# Patient Record
Sex: Male | Born: 1980 | Race: Black or African American | Hispanic: No | Marital: Single | Smoking: Former smoker
Health system: Southern US, Community
[De-identification: ages and names within clinical notes are randomized; demographics above are authoritative.]

## PROBLEM LIST (undated history)

## (undated) DIAGNOSIS — T7840XA Allergy, unspecified, initial encounter: Secondary | ICD-10-CM

## (undated) DIAGNOSIS — E785 Hyperlipidemia, unspecified: Secondary | ICD-10-CM

## (undated) HISTORY — DX: Allergy, unspecified, initial encounter: T78.40XA

## (undated) HISTORY — DX: Hyperlipidemia, unspecified: E78.5

---

## 2010-02-09 ENCOUNTER — Encounter: Payer: Self-pay | Admitting: Family Medicine

## 2014-01-04 ENCOUNTER — Emergency Department: Payer: Self-pay | Admitting: Emergency Medicine

## 2014-01-26 ENCOUNTER — Emergency Department: Payer: Self-pay | Admitting: Emergency Medicine

## 2014-01-26 LAB — COMPREHENSIVE METABOLIC PANEL
AST: 26 U/L (ref 15–37)
Albumin: 4.1 g/dL (ref 3.4–5.0)
Alkaline Phosphatase: 77 U/L
Anion Gap: 5 — ABNORMAL LOW (ref 7–16)
BUN: 18 mg/dL (ref 7–18)
Bilirubin,Total: 0.3 mg/dL (ref 0.2–1.0)
CHLORIDE: 102 mmol/L (ref 98–107)
Calcium, Total: 9 mg/dL (ref 8.5–10.1)
Co2: 30 mmol/L (ref 21–32)
Creatinine: 1.6 mg/dL — ABNORMAL HIGH (ref 0.60–1.30)
EGFR (African American): >60
EGFR (Non-African Amer.): 53 — ABNORMAL LOW
Glucose: 140 mg/dL — ABNORMAL HIGH (ref 65–99)
OSMOLALITY: 278 (ref 275–301)
POTASSIUM: 3.7 mmol/L (ref 3.5–5.1)
SGPT (ALT): 33 U/L
Sodium: 137 mmol/L (ref 136–145)
Total Protein: 7.6 g/dL (ref 6.4–8.2)

## 2014-01-26 LAB — CBC
HCT: 39.8 % — ABNORMAL LOW (ref 40.0–52.0)
HGB: 13.2 g/dL (ref 13.0–18.0)
MCH: 23.9 pg — AB (ref 26.0–34.0)
MCHC: 33.1 g/dL (ref 32.0–36.0)
MCV: 72 fL — AB (ref 80–100)
PLATELETS: 357 10*3/uL (ref 150–440)
RBC: 5.51 10*6/uL (ref 4.40–5.90)
RDW: 16.9 % — ABNORMAL HIGH (ref 11.5–14.5)
WBC: 16.2 10*3/uL — ABNORMAL HIGH (ref 3.8–10.6)

## 2014-01-26 LAB — URINALYSIS, COMPLETE
Bacteria: NONE SEEN
Bilirubin,UR: NEGATIVE
Blood: NEGATIVE
Glucose,UR: NEGATIVE mg/dL (ref 0–75)
KETONE: NEGATIVE
Leukocyte Esterase: NEGATIVE
Nitrite: NEGATIVE
Ph: 6 (ref 4.5–8.0)
Protein: NEGATIVE
RBC, UR: NONE SEEN /HPF (ref 0–5)
SQUAMOUS EPITHELIAL: NONE SEEN
Specific Gravity: 1.026 (ref 1.003–1.030)
WBC UR: NONE SEEN /HPF (ref 0–5)

## 2014-01-31 DIAGNOSIS — I82409 Acute embolism and thrombosis of unspecified deep veins of unspecified lower extremity: Secondary | ICD-10-CM

## 2014-01-31 HISTORY — DX: Acute embolism and thrombosis of unspecified deep veins of unspecified lower extremity: I82.409

## 2016-08-05 ENCOUNTER — Emergency Department
Admission: EM | Admit: 2016-08-05 | Discharge: 2016-08-05 | Disposition: A | Discharge status: Home / Self Care | Payer: BLUE CROSS/BLUE SHIELD | Attending: Emergency Medicine | Admitting: Emergency Medicine

## 2016-08-05 ENCOUNTER — Emergency Department: Payer: BLUE CROSS/BLUE SHIELD

## 2016-08-05 DIAGNOSIS — Y92410 Unspecified street and highway as the place of occurrence of the external cause: Secondary | ICD-10-CM | POA: Insufficient documentation

## 2016-08-05 DIAGNOSIS — G44309 Post-traumatic headache, unspecified, not intractable: Secondary | ICD-10-CM | POA: Diagnosis not present

## 2016-08-05 DIAGNOSIS — Y9389 Activity, other specified: Secondary | ICD-10-CM | POA: Diagnosis not present

## 2016-08-05 DIAGNOSIS — G44319 Acute post-traumatic headache, not intractable: Secondary | ICD-10-CM

## 2016-08-05 DIAGNOSIS — Y999 Unspecified external cause status: Secondary | ICD-10-CM | POA: Insufficient documentation

## 2016-08-05 DIAGNOSIS — S0990XA Unspecified injury of head, initial encounter: Secondary | ICD-10-CM | POA: Diagnosis present

## 2016-08-05 DIAGNOSIS — S161XXA Strain of muscle, fascia and tendon at neck level, initial encounter: Secondary | ICD-10-CM | POA: Diagnosis not present

## 2016-08-05 DIAGNOSIS — S39012A Strain of muscle, fascia and tendon of lower back, initial encounter: Secondary | ICD-10-CM

## 2016-08-05 MED ORDER — METHOCARBAMOL 500 MG PO TABS: 500.0000 mg | ORAL_TABLET | Freq: Four times a day (QID) | ORAL | 0 refills | Status: DC

## 2016-08-05 MED ORDER — MELOXICAM 15 MG PO TABS: 15.0000 mg | ORAL_TABLET | Freq: Every day | ORAL | 0 refills | Status: DC
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
## 2018-07-11 ENCOUNTER — Ambulatory Visit: Payer: BLUE CROSS/BLUE SHIELD | Admitting: Physician Assistant

## 2018-07-11 NOTE — Progress Notes (Signed)
Patient rescheduled and was not seen

## 2019-04-11 ENCOUNTER — Other Ambulatory Visit: Payer: Self-pay

## 2019-04-12 ENCOUNTER — Ambulatory Visit: Payer: Managed Care, Other (non HMO) | Admitting: Family Medicine

## 2019-04-12 ENCOUNTER — Encounter: Payer: Self-pay | Admitting: Family Medicine

## 2019-04-12 VITALS — BP 136/80 | HR 91 | Temp 96.5°F | Resp 16 | Ht 76.0 in | Wt 271.0 lb

## 2019-04-12 DIAGNOSIS — Z131 Encounter for screening for diabetes mellitus: Secondary | ICD-10-CM

## 2019-04-12 DIAGNOSIS — Z13220 Encounter for screening for lipoid disorders: Secondary | ICD-10-CM | POA: Diagnosis not present

## 2019-04-12 DIAGNOSIS — R600 Localized edema: Secondary | ICD-10-CM

## 2019-04-12 DIAGNOSIS — J3089 Other allergic rhinitis: Secondary | ICD-10-CM | POA: Diagnosis not present

## 2019-04-12 DIAGNOSIS — E669 Obesity, unspecified: Secondary | ICD-10-CM | POA: Insufficient documentation

## 2019-04-12 DIAGNOSIS — E6609 Other obesity due to excess calories: Secondary | ICD-10-CM | POA: Diagnosis not present

## 2019-04-12 DIAGNOSIS — Z6832 Body mass index (BMI) 32.0-32.9, adult: Secondary | ICD-10-CM

## 2019-04-12 DIAGNOSIS — E66811 Obesity, class 1: Secondary | ICD-10-CM | POA: Insufficient documentation

## 2019-04-12 LAB — CBC
HCT: 44.7 % (ref 39.0–52.0)
Hemoglobin: 15.3 g/dL (ref 13.0–17.0)
MCHC: 34.3 g/dL (ref 30.0–36.0)
MCV: 74.6 fl — ABNORMAL LOW (ref 78.0–100.0)
Platelets: 274 10*3/uL (ref 150.0–400.0)
RBC: 6 Mil/uL — ABNORMAL HIGH (ref 4.22–5.81)
RDW: 17.4 % — ABNORMAL HIGH (ref 11.5–15.5)
WBC: 10.4 10*3/uL (ref 4.0–10.5)

## 2019-04-12 LAB — LIPID PANEL
Cholesterol: 180 mg/dL (ref 0–200)
HDL: 33.4 mg/dL — ABNORMAL LOW (ref >39.00)
LDL Cholesterol: 123 mg/dL — ABNORMAL HIGH (ref 0–99)
NonHDL: 146.16
Total CHOL/HDL Ratio: 5
Triglycerides: 117 mg/dL (ref 0.0–149.0)
VLDL: 23.4 mg/dL (ref 0.0–40.0)

## 2019-04-12 LAB — BASIC METABOLIC PANEL
BUN: 11 mg/dL (ref 6–23)
CO2: 29 mEq/L (ref 19–32)
Calcium: 9.4 mg/dL (ref 8.4–10.5)
Chloride: 103 mEq/L (ref 96–112)
Creatinine, Ser: 0.88 mg/dL (ref 0.40–1.50)
GFR: 116.22 mL/min (ref >60.00)
Glucose, Bld: 133 mg/dL — ABNORMAL HIGH (ref 70–99)
Potassium: 4.1 mEq/L (ref 3.5–5.1)
Sodium: 137 mEq/L (ref 135–145)

## 2019-04-12 MED ORDER — PREDNISONE 20 MG PO TABS: 40.0000 mg | ORAL_TABLET | Freq: Every day | ORAL | 0 refills | Status: AC

## 2019-04-12 MED ORDER — MOMETASONE FUROATE 50 MCG/ACT NA SUSP: 2.0000 | Freq: Every day | NASAL | 12 refills | Status: DC

## 2019-04-12 MED ORDER — MONTELUKAST SODIUM 10 MG PO TABS: 10.0000 mg | ORAL_TABLET | Freq: Every day | ORAL | 3 refills | Status: DC

## 2019-04-12 NOTE — Progress Notes (Signed)
HPI:   Derek Wang is a 39 y.o. male, who is here today to establish care.  Former PCP: He moved from [facility] recently. Last preventive routine visit: "Years ago."  Chronic medical problems: Allergies. 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 He is not on anticoagulation. Former smoker,quit about 12 years ago.  He does not exercise regularly and has not been complaint with following a healthful diet.  Concerns today: "Sinuses" Eye and nose pruritus,sneezing,nasal congestion,rhinorrha,and post nasal drainage. + Tearing eyes.  Symptoms have been going on for " a while."  Negative for fever,chills,sore throat, cough,wheezing,or SOB. He has taken Zyrtec but caused drowsiness. Flonase nasal spray has not helped.  Review of Systems  Constitutional: Positive for fatigue. Negative for activity change, appetite change and fever.  HENT: Negative for mouth sores, nosebleeds and trouble swallowing.   Eyes: Negative for redness and visual disturbance.  Cardiovascular: Positive for leg swelling (LLE ,chronic). Negative for chest pain and palpitations.  Gastrointestinal: Negative for abdominal pain, nausea and vomiting.  Genitourinary: Negative for decreased urine volume and hematuria.  Musculoskeletal: Negative for gait problem and myalgias.  Skin: Negative for rash and wound.  Allergic/Immunologic: Positive for environmental allergies.  Neurological: Negative for weakness and headaches.  Psychiatric/Behavioral: Negative for confusion. The patient is nervous/anxious.   Rest see pertinent positives and negatives per HPI.  No current outpatient medications on file prior to visit.   No current facility-administered medications on file prior to visit.   History reviewed. No pertinent past medical history. No Known Allergies  History reviewed. No pertinent family history.  Social History   Socioeconomic History  . Marital status: Single   Spouse name: Not on file  . Number of children: Not on file  . Years of education: Not on file  . Highest education level: Not on file  Occupational History  . Not on file  Tobacco Use  . Smoking status: Former Research scientist (life sciences)  . Smokeless tobacco: Never Used  Substance and Sexual Activity  . Alcohol use: Not Currently  . Drug use: Never  . Sexual activity: Yes    Partners: Female  Other Topics Concern  . Not on file  Social History Narrative  . Not on file   Social Determinants of Health   Financial Resource Strain:   . Difficulty of Paying Living Expenses:   Food Insecurity:   . Worried About Charity fundraiser in the Last Year:   . Arboriculturist in the Last Year:   Transportation Needs:   . Film/video editor (Medical):   Marland Kitchen Lack of Transportation (Non-Medical):   Physical Activity:   . Days of Exercise per Week:   . Minutes of Exercise per Session:   Stress:   . Feeling of Stress :   Social Connections:   . Frequency of Communication with Friends and Family:   . Frequency of Social Gatherings with Friends and Family:   . Attends Religious Services:   . Active Member of Clubs or Organizations:   . Attends Archivist Meetings:   Marland Kitchen Marital Status:     Vitals:   04/12/19 0828  BP: 136/80  Pulse: 91  Resp: 16  Temp: (!) 96.5 F (35.8 C)  SpO2: 98%    Body mass index is 32.99 kg/m.   Physical Exam  Nursing note and vitals reviewed. Constitutional: He is oriented to person, place, and time. He appears well-developed. No distress.  HENT:  Head: Normocephalic and atraumatic.  Nose: Rhinorrhea and septal deviation present. Right sinus exhibits no maxillary sinus tenderness and no frontal sinus tenderness. Left sinus exhibits no maxillary sinus tenderness and no frontal sinus tenderness.  Mouth/Throat: Oropharynx is clear and moist and mucous membranes are normal.  Hypertrophic turbinate left side. Nasal voice.  Eyes: Pupils are equal, round, and  reactive to light. Conjunctivae are normal.  Cardiovascular: Normal rate and regular rhythm.  No murmur heard. Pulses:      Dorsalis pedis pulses are 2+ on the right side and 2+ on the left side.  Respiratory: Effort normal and breath sounds normal. No respiratory distress.  GI: Soft. He exhibits no mass. There is no hepatomegaly. There is no abdominal tenderness.  Musculoskeletal:        General: Edema (1+ pitting LE edema,L>R) present.  Lymphadenopathy:    He has no cervical adenopathy.  Neurological: He is alert and oriented to person, place, and time. He has normal strength. No cranial nerve deficit. Gait normal.  Skin: Skin is warm. No rash noted. No erythema.  Psychiatric: He has a normal mood and affect. Cognition and memory are normal.  Well groomed, good eye contact.    ASSESSMENT AND PLAN:  Derek Wang was seen today for establish care.  Diagnoses and all orders for this visit:  Lab Results  Component Value Date   WBC 10.4 04/12/2019   HGB 15.3 04/12/2019   HCT 44.7 04/12/2019   MCV 74.6 (L) 04/12/2019   PLT 274.0 04/12/2019   Lab Results  Component Value Date   CHOL 180 04/12/2019   HDL 33.40 (L) 04/12/2019   LDLCALC 123 (H) 04/12/2019   TRIG 117.0 04/12/2019   CHOLHDL 5 04/12/2019   Lab Results  Component Value Date   CREATININE 0.88 04/12/2019   BUN 11 04/12/2019   NA 137 04/12/2019   K 4.1 04/12/2019   CL 103 04/12/2019   CO2 29 04/12/2019    Non-seasonal allergic rhinitis, unspecified trigger Educated about Dx,prognosis,and treatment options. Nasal saline irrigations as needed. Stop Flonase and try Nasonex nasal spray. Singulair 10 mg daily. Prednisone side effects discussed.  -     CBC -     mometasone (NASONEX) 50 MCG/ACT nasal spray; Place 2 sprays into the nose daily. -     montelukast (SINGULAIR) 10 MG tablet; Take 1 tablet (10 mg total) by mouth at bedtime. -     predniSONE (DELTASONE) 20 MG tablet; Take 2 tablets (40 mg total) by mouth  daily with breakfast for 5 days.  Class 1 obesity due to excess calories with body mass index (BMI) of 32.0 to 32.9 in adult, unspecified whether serious comorbidity present We discussed benefits of wt loss as well as adverse effects of obesity. Consistency with healthy diet and physical activity recommended.  Diabetes mellitus screening -     Basic metabolic panel  Screening for lipid disorders -     Lipid panel  Lower extremity edema Chronic,lymphedema. We will obtain past records and determine the need for further studies and/or anticoagulation. He has compression stocks he wears frequent and helps.  Return in about 2 months (around 06/12/2019) for allergic rhinitis..     Cynthis Purington G. [place], MD  [facility]. [place] [facility].    A few things to remember from today's visit:   Non-seasonal allergic rhinitis, unspecified trigger - Plan: CBC, mometasone (NASONEX) 50 MCG/ACT nasal spray, montelukast (SINGULAIR) 10 MG tablet, predniSONE (DELTASONE) 20 MG tablet  Class  1 obesity due to excess calories with body mass index (BMI) of 32.0 to 32.9 in adult, unspecified whether serious comorbidity present  Diabetes mellitus screening - Plan: Basic metabolic panel  Screening for lipid disorders - Plan: Lipid panel  Lower extremity edema   There are 2 forms of allergic rhinitis: . Seasonal (hay fever): Caused by an allergy to pollen and/or mold spores in the air. Pollen is the fine powder that comes from the stamen of flowering plants. It can be carried through the air and is easily inhaled. Symptoms are seasonal and usually occur in spring, late summer, and fall. Marland Kitchen Perennial: Caused by other allergens such as dust mites, pet hair or dander, or mold. Symptoms occur year-round.  Symptoms: Your symptoms can vary, depending on the severity of your allergies. Symptoms can include: Sneezing, coughing.itching (mostly eyes, nose, mouth, throat and skin),runny nose,stuffy  nose.headache,pressure in the nose and cheeks,ear fullness and popping, sore throat.watery, red, or swollen eyes,dark circles under your eyes,trouble smelling, and sometimes hives.  Allergic rhinitis cannot be prevented. You can help your symptoms by avoiding the things that you are allergic, including: . Keeping windows closed. This is especially important during high-pollen seasons. . Washing your hands after petting animals. . Using dust- and mite-proof bedding and mattress covers. . Wearing glasses outside to protect your eyes. . Showering before bed to wash off allergens from hair and skin. You can also avoid things that can make your symptoms worse, such as: . aerosol sprays . air pollution . cold temperatures . humidity . irritating fumes . tobacco smoke . wind . wood smoke.   Antihistamines help reduce the sneezing, runny nose, and itchiness of allergies. These come in pill form and as nasal sprays. Allegra,Zyrtec,or Claritin are some examples. Decongestants, such as pseudoephedrine and phenylephrine, help temporarily relieve the stuffy nose of allergies. Decongestants are found in many medicines and come as pills, nose sprays, and nose drops. They could increase heart rate and cause tachycardia and tremor. Nasal Afrin should not be used for more than 3 days because you can become dependent on them. This causes you to feel even more stopped-up when you try to quit using them.  Nasal sprays: steroids or antihistaminics. Over the counter intranasal sterids: Nasocort,Rhinocort,or Flonase.You won't notice their benefits for up to 2 weeks after starting them. Allergy shots or sublingual tablets when other treatment do not help.This is done by immunologists.

## 2019-04-12 NOTE — Patient Instructions (Addendum)
A few things to remember from today's visit:   Diabetes mellitus screening - Plan: Basic metabolic panel  Non-seasonal allergic rhinitis, unspecified trigger - Plan: CBC  Screening for lipid disorders - Plan: Lipid panel  Lower extremity edema  Take Prednisone with food. Please sings a release form,so we can obtain hospital records. Nasal irrigations with saline.  There are 2 forms of allergic rhinitis: . Seasonal (hay fever): Caused by an allergy to pollen and/or mold spores in the air. Pollen is the fine powder that comes from the stamen of flowering plants. It can be carried through the air and is easily inhaled. Symptoms are seasonal and usually occur in spring, late summer, and fall. Marland Kitchen Perennial: Caused by other allergens such as dust mites, pet hair or dander, or mold. Symptoms occur year-round.  Symptoms: Your symptoms can vary, depending on the severity of your allergies. Symptoms can include: Sneezing, coughing.itching (mostly eyes, nose, mouth, throat and skin),runny nose,stuffy nose.headache,pressure in the nose and cheeks,ear fullness and popping, sore throat.watery, red, or swollen eyes,dark circles under your eyes,trouble smelling, and sometimes hives.  Allergic rhinitis cannot be prevented. You can help your symptoms by avoiding the things that you are allergic, including: . Keeping windows closed. This is especially important during high-pollen seasons. . Washing your hands after petting animals. . Using dust- and mite-proof bedding and mattress covers. . Wearing glasses outside to protect your eyes. . Showering before bed to wash off allergens from hair and skin. You can also avoid things that can make your symptoms worse, such as: . aerosol sprays . air pollution . cold temperatures . humidity . irritating fumes . tobacco smoke . wind . wood smoke.   Antihistamines help reduce the sneezing, runny nose, and itchiness of allergies. These come in pill form and as  nasal sprays. Allegra,Zyrtec,or Claritin are some examples. Decongestants, such as pseudoephedrine and phenylephrine, help temporarily relieve the stuffy nose of allergies. Decongestants are found in many medicines and come as pills, nose sprays, and nose drops. They could increase heart rate and cause tachycardia and tremor. Nasal Afrin should not be used for more than 3 days because you can become dependent on them. This causes you to feel even more stopped-up when you try to quit using them.  Nasal sprays: steroids or antihistaminics. Over the counter intranasal sterids: Nasocort,Rhinocort,or Flonase.You won't notice their benefits for up to 2 weeks after starting them. Allergy shots or sublingual tablets when other treatment do not help.This is done by immunologists.   Please be sure medication list is accurate. If a new problem present, please set up appointment sooner than planned today.

## 2019-04-14 ENCOUNTER — Encounter: Payer: Self-pay | Admitting: Family Medicine

## 2019-04-19 ENCOUNTER — Other Ambulatory Visit: Payer: Self-pay | Admitting: Family Medicine

## 2019-04-19 MED ORDER — TRIAMCINOLONE ACETONIDE 55 MCG/ACT NA AERO
2.0000 | INHALATION_SPRAY | Freq: Every day | NASAL | 2 refills | Status: DC
Start: 2019-04-19 — End: 2022-04-13

## 2019-04-24 ENCOUNTER — Telehealth: Payer: Self-pay | Admitting: Family Medicine

## 2019-04-24 NOTE — Telephone Encounter (Signed)
ROI faxed to [facility] 3/24/21fbg

## 2019-04-25 NOTE — Telephone Encounter (Signed)
Recv'd medical records from [facility] forwarded 45 pages to Dr. Kathie Rhodes Jordan([facility])3/25/21fbg

## 2019-06-12 ENCOUNTER — Ambulatory Visit: Payer: Managed Care, Other (non HMO) | Admitting: Family Medicine

## 2019-06-17 ENCOUNTER — Other Ambulatory Visit: Payer: Self-pay

## 2019-06-18 ENCOUNTER — Encounter: Payer: Self-pay | Admitting: Family Medicine

## 2019-06-18 ENCOUNTER — Ambulatory Visit (INDEPENDENT_AMBULATORY_CARE_PROVIDER_SITE_OTHER): Payer: Managed Care, Other (non HMO) | Admitting: Family Medicine

## 2019-06-18 VITALS — BP 140/90 | HR 76 | Temp 98.3°F | Resp 16 | Ht 76.0 in | Wt 270.2 lb

## 2019-06-18 DIAGNOSIS — J3089 Other allergic rhinitis: Secondary | ICD-10-CM

## 2019-06-18 DIAGNOSIS — E6609 Other obesity due to excess calories: Secondary | ICD-10-CM

## 2019-06-18 DIAGNOSIS — Z6832 Body mass index (BMI) 32.0-32.9, adult: Secondary | ICD-10-CM

## 2019-06-18 DIAGNOSIS — E785 Hyperlipidemia, unspecified: Secondary | ICD-10-CM

## 2019-06-18 DIAGNOSIS — E1169 Type 2 diabetes mellitus with other specified complication: Secondary | ICD-10-CM | POA: Insufficient documentation

## 2019-06-18 DIAGNOSIS — R739 Hyperglycemia, unspecified: Secondary | ICD-10-CM

## 2019-06-18 DIAGNOSIS — G4730 Sleep apnea, unspecified: Secondary | ICD-10-CM

## 2019-06-18 DIAGNOSIS — E669 Obesity, unspecified: Secondary | ICD-10-CM

## 2019-06-18 LAB — MICROALBUMIN / CREATININE URINE RATIO
Creatinine,U: 221.4 mg/dL
Microalb Creat Ratio: 1.8 mg/g (ref 0.0–30.0)
Microalb, Ur: 4 mg/dL — ABNORMAL HIGH (ref 0.0–1.9)

## 2019-06-18 LAB — POCT GLYCOSYLATED HEMOGLOBIN (HGB A1C): Hemoglobin A1C: 6.7 % — AB (ref 4.0–5.6)

## 2019-06-18 MED ORDER — FLUTICASONE PROPIONATE 50 MCG/ACT NA SUSP: 2.0000 | Freq: Every day | NASAL | 6 refills | Status: DC

## 2019-06-18 MED ORDER — ATORVASTATIN CALCIUM 10 MG PO TABS: 10.0000 mg | ORAL_TABLET | Freq: Every day | ORAL | 3 refills | Status: DC

## 2019-06-18 NOTE — Patient Instructions (Addendum)
A few things to remember from today's visit:   Start Aspirin 81 meg daily. Continue working on wt loss. Sleep study will be arranged. If possible monitor blood pressure occasionally.  Low salt diet.  Zyrtec 10 mg am and continue Singulair at night. Nasal saline irrigations and try Flonase again.  Today your hemoglobin A1C showed diabetes. We can try with no medication for now, nutrition referral placed.  Atorvastatin 10 mg added for cardiovascular protection.  If you need refills please call your pharmacy. Do not use My Chart to request refills or for acute issues that need immediate attention.    Diabetes Basics  Diabetes (diabetes mellitus) is a long-term (chronic) disease. It occurs when the body does not properly use sugar (glucose) that is released from food after you eat. Diabetes may be caused by one or both of these problems:  Your pancreas does not make enough of a hormone called insulin.  Your body does not react in a normal way to insulin that it makes. Insulin lets sugars (glucose) go into cells in your body. This gives you energy. If you have diabetes, sugars cannot get into cells. This causes high blood sugar (hyperglycemia). Follow these instructions at home: How is diabetes treated? You may need to take insulin or other diabetes medicines daily to keep your blood sugar in balance. Take your diabetes medicines every day as told by your doctor. List your diabetes medicines here: Diabetes medicines  Name of medicine: ______________________________ ? Amount (dose): _______________ Time (a.m./p.m.): _______________ Notes: ___________________________________  Name of medicine: ______________________________ ? Amount (dose): _______________ Time (a.m./p.m.): _______________ Notes: ___________________________________  Name of medicine: ______________________________ ? Amount (dose): _______________ Time (a.m./p.m.): _______________ Notes:  ___________________________________ If you use insulin, you will learn how to give yourself insulin by injection. You may need to adjust the amount based on the food that you eat. List the types of insulin you use here: Insulin  Insulin type: ______________________________ ? Amount (dose): _______________ Time (a.m./p.m.): _______________ Notes: ___________________________________  Insulin type: ______________________________ ? Amount (dose): _______________ Time (a.m./p.m.): _______________ Notes: ___________________________________  Insulin type: ______________________________ ? Amount (dose): _______________ Time (a.m./p.m.): _______________ Notes: ___________________________________  Insulin type: ______________________________ ? Amount (dose): _______________ Time (a.m./p.m.): _______________ Notes: ___________________________________  Insulin type: ______________________________ ? Amount (dose): _______________ Time (a.m./p.m.): _______________ Notes: ___________________________________ How do I manage my blood sugar?  Check your blood sugar levels using a blood glucose monitor as directed by your doctor. Your doctor will set treatment goals for you. Generally, you should have these blood sugar levels:  Before meals (preprandial): 80-130 mg/dL (4.4-7.2 mmol/L).  After meals (postprandial): below 180 mg/dL (10 mmol/L).  A1c level: less than 7%. Write down the times that you will check your blood sugar levels: Blood sugar checks  Time: _______________ Notes: ___________________________________  Time: _______________ Notes: ___________________________________  Time: _______________ Notes: ___________________________________  Time: _______________ Notes: ___________________________________  Time: _______________ Notes: ___________________________________  Time: _______________ Notes: ___________________________________  What do I need to know about low blood sugar? Low  blood sugar is called hypoglycemia. This is when blood sugar is at or below 70 mg/dL (3.9 mmol/L). Symptoms may include:  Feeling: ? Hungry. ? Worried or nervous (anxious). ? Sweaty and clammy. ? Confused. ? Dizzy. ? Sleepy. ? Sick to your stomach (nauseous).  Having: ? A fast heartbeat. ? A headache. ? A change in your vision. ? Tingling or no feeling (numbness) around the mouth, lips, or tongue. ? Jerky movements that you cannot control (seizure).  Having trouble with: ? Moving (  coordination). ? Sleeping. ? Passing out (fainting). ? Getting upset easily (irritability). Treating low blood sugar To treat low blood sugar, eat or drink something sugary right away. If you can think clearly and swallow safely, follow the 15:15 rule:  Take 15 grams of a fast-acting carb (carbohydrate). Talk with your doctor about how much you should take.  Some fast-acting carbs are: ? Sugar tablets (glucose pills). Take 3-4 glucose pills. ? 6-8 pieces of hard candy. ? 4-6 oz (120-150 mL) of fruit juice. ? 4-6 oz (120-150 mL) of regular (not diet) soda. ? 1 Tbsp (15 mL) honey or sugar.  Check your blood sugar 15 minutes after you take the carb.  If your blood sugar is still at or below 70 mg/dL (3.9 mmol/L), take 15 grams of a carb again.  If your blood sugar does not go above 70 mg/dL (3.9 mmol/L) after 3 tries, get help right away.  After your blood sugar goes back to normal, eat a meal or a snack within 1 hour. Treating very low blood sugar If your blood sugar is at or below 54 mg/dL (3 mmol/L), you have very low blood sugar (severe hypoglycemia). This is an emergency. Do not wait to see if the symptoms will go away. Get medical help right away. Call your local emergency services (911 in the [place] Do not drive yourself to the hospital. Questions to ask your health care provider  Do I need to meet with a diabetes educator?  What equipment will I need to care for myself at home?  What  diabetes medicines do I need? When should I take them?  How often do I need to check my blood sugar?  What number can I call if I have questions?  When is my next doctor's visit?  Where can I find a support group for people with diabetes? Where to find more information  American Diabetes Association: www.diabetes.org  American Association of Diabetes Educators: www.diabeteseducator.org/patient-resources Contact a doctor if:  Your blood sugar is at or above 240 mg/dL (03.8 mmol/L) for 2 days in a row.  You have been sick or have had a fever for 2 days or more, and you are not getting better.  You have any of these problems for more than 6 hours: ? You cannot eat or drink. ? You feel sick to your stomach (nauseous). ? You throw up (vomit). ? You have watery poop (diarrhea). Get help right away if:  Your blood sugar is lower than 54 mg/dL (3 mmol/L).  You get confused.  You have trouble: ? Thinking clearly. ? Breathing. Summary  Diabetes (diabetes mellitus) is a long-term (chronic) disease. It occurs when the body does not properly use sugar (glucose) that is released from food after digestion.  Take insulin and diabetes medicines as told.  Check your blood sugar every day, as often as told.  Keep all follow-up visits as told by your doctor. This is important. This information is not intended to replace advice given to you by your health care provider. Make sure you discuss any questions you have with your health care provider. Document Revised: 10/10/2018 Document Reviewed: 04/21/2017 Elsevier Patient Education  2020 [employer]. Please be sure medication list is accurate. If a new problem present, please set up appointment sooner than planned today.

## 2019-06-18 NOTE — Progress Notes (Signed)
HPI:  [name] is a 39 y.o. male, who is here today for follow up.   He was last seen on 04/12/19. He did not receive Nasonex nasal spray, his insurance did not cover it. Nasocort was expensive. He is not doing nasal irrigations. Zyrtec and Singulair have helped.  Laud snoring. Sleep apnea has been witnessed in the past by girlfriend. Sometimes he does not feel rested when he first gets up. Occasional morning headache.  Reviewing records I found one episode of DVT LLE in 2012. In 2011 he presented with palpate fossa pain, thought to be meniscal related. Apparently he had coagulation studies done in 2012, I do not see results.  Glucose has been elevated in the past: 13 (140). No known Hx of DM II. Denies polydipsia,polyuria, or polyphagia.  Review of Systems  Constitutional: Negative for activity change, appetite change, fatigue and fever.  HENT: Negative for mouth sores, nosebleeds and sore throat.   Respiratory: Negative for cough, shortness of breath and wheezing.   Cardiovascular: Negative for chest pain, palpitations and leg swelling.  Gastrointestinal: Negative for abdominal pain.  Genitourinary: Negative for decreased urine volume, dysuria and hematuria.  Neurological: Negative for syncope, facial asymmetry and weakness.  Rest of ROS, see pertinent positives sand negatives in HPI  Current Outpatient Medications on File Prior to Visit  Medication Sig Dispense Refill  . montelukast (SINGULAIR) 10 MG tablet Take 1 tablet (10 mg total) by mouth at bedtime. 30 tablet 3  . triamcinolone (NASACORT) 55 MCG/ACT AERO nasal inhaler Place 2 sprays into the nose daily. (Patient not taking: Reported on 06/18/2019) 1 Inhaler 2   No current facility-administered medications on file prior to visit.   Past Medical History:  Diagnosis Date  . Allergy   . DVT (deep venous thrombosis) (HCC) 2016  . Hyperlipidemia    No Known Allergies  Social History    Socioeconomic History  . Marital status: Single    Spouse name: Not on file  . Number of children: Not on file  . Years of education: Not on file  . Highest education level: Not on file  Occupational History  . Not on file  Tobacco Use  . Smoking status: Former Games developer  . Smokeless tobacco: Never Used  Substance and Sexual Activity  . Alcohol use: Not Currently  . Drug use: Never  . Sexual activity: Yes    Partners: Female  Other Topics Concern  . Not on file  Social History Narrative  . Not on file   Social Determinants of Health   Financial Resource Strain:   . Difficulty of Paying Living Expenses:   Food Insecurity:   . Worried About Programme researcher, broadcasting/film/video in the Last Year:   . Barista in the Last Year:   Transportation Needs:   . Freight forwarder (Medical):   Marland Kitchen Lack of Transportation (Non-Medical):   Physical Activity:   . Days of Exercise per Week:   . Minutes of Exercise per Session:   Stress:   . Feeling of Stress :   Social Connections:   . Frequency of Communication with Friends and Family:   . Frequency of Social Gatherings with Friends and Family:   . Attends Religious Services:   . Active Member of Clubs or Organizations:   . Attends Banker Meetings:   Marland Kitchen Marital Status:     Vitals:   06/18/19 0715  BP: 140/90  Pulse: 76  Resp:  16  Temp: 98.3 F (36.8 C)  SpO2: 97%   Body mass index is 32.89 kg/m. Wt Readings from Last 3 Encounters:  06/18/19 270 lb 3.2 oz (122.6 kg)  04/12/19 271 lb (122.9 kg)  08/05/16 270 lb (122.5 kg)   Physical Exam  Nursing note and vitals reviewed. Constitutional: He is oriented to person, place, and time. He appears well-developed. No distress.  HENT:  Head: Normocephalic and atraumatic.  Nose: Septal deviation present.  Mouth/Throat: Oropharynx is clear and moist and mucous membranes are normal.  Hypertrophic turbinates.  Eyes: Pupils are equal, round, and reactive to light.  Conjunctivae are normal.  Cardiovascular: Normal rate and regular rhythm.  No murmur heard. Respiratory: Effort normal and breath sounds normal. No respiratory distress.  GI: Soft. He exhibits no mass. There is no hepatomegaly. There is no abdominal tenderness.  Musculoskeletal:        General: Edema (Trace pitting LE edema,bilateral.) present.  Lymphadenopathy:    He has no cervical adenopathy.  Neurological: He is alert and oriented to person, place, and time. He has normal strength. No cranial nerve deficit. Gait normal.  Skin: Skin is warm. No rash noted. No erythema.  Psychiatric: He has a normal mood and affect.  Well groomed, good eye contact.   ASSESSMENT AND PLAN:  [name] was seen today for follow-up.  Orders Placed This Encounter  Procedures  . Microalbumin / creatinine urine ratio  . Amb Referral to Nutrition and Diabetic E  . POC HgB A1c  . Home sleep test   Lab Results  Component Value Date   MICROALBUR 4.0 (H) 06/18/2019   Lab Results  Component Value Date   HGBA1C 6.7 (A) 06/18/2019    1. Diabetes mellitus type 2 in obese [facility]) New Dx. Educated about prognosis and treatment options. He will try non pharmacologic treatment. Nutrition and diabetes education will be arranged.  2. Hyperglycemia - POC HgB A1c  3. Class 1 obesity due to excess calories with body mass index (BMI) of 32.0 to 32.9 in adult, unspecified whether serious comorbidity present We discussed benefits of wt loss as well as adverse effects of obesity. Consistency with healthy diet and physical activity recommended.  4. Sleep apnea, unspecified type We discussed adverse effects of OSA. Wt loss will help. Sleep study will be arranged.  5. Non-seasonal allergic rhinitis, unspecified trigger He agrees with trying Flonase again. Nasal saline irrigations as needed. No changes in Singulair or Zyrtec.  - fluticasone (FLONASE) 50 MCG/ACT nasal spray; Place 2 sprays into  both nostrils daily.  Dispense: 16 g; Refill: 6  6. Dyslipidemia associated with type 2 diabetes mellitus (HCC) Benefit of statins discussed. Atorvastatin side effects discussed. Low fat diet also recommended.  - atorvastatin (LIPITOR) 10 MG tablet; Take 1 tablet (10 mg total) by mouth daily.  Dispense: 90 tablet; Refill: 3   Return in about 16 weeks (around 10/08/2019).   Dreshon Proffit G. [place], MD  [facility]. [place] office.   A few things to remember from today's visit:   Start Aspirin 81 meg daily. Continue working on wt loss. Sleep study will be arranged. If possible monitor blood pressure occasionally.  Low salt diet.  Zyrtec 10 mg am and continue Singulair at night. Nasal saline irrigations and try Flonase again.  Today your hemoglobin A1C showed diabetes. We can try with no medication for now, nutrition referral placed.  Atorvastatin 10 mg added for cardiovascular protection.  If you need refills please call  your pharmacy. Do not use My Chart to request refills or for acute issues that need immediate attention.    Diabetes Basics  Diabetes (diabetes mellitus) is a long-term (chronic) disease. It occurs when the body does not properly use sugar (glucose) that is released from food after you eat. Diabetes may be caused by one or both of these problems:  Your pancreas does not make enough of a hormone called insulin.  Your body does not react in a normal way to insulin that it makes. Insulin lets sugars (glucose) go into cells in your body. This gives you energy. If you have diabetes, sugars cannot get into cells. This causes high blood sugar (hyperglycemia). Follow these instructions at home: How is diabetes treated? You may need to take insulin or other diabetes medicines daily to keep your blood sugar in balance. Take your diabetes medicines every day as told by your doctor. List your diabetes medicines here:  How do I manage my blood sugar?  Check  your blood sugar levels using a blood glucose monitor as directed by your doctor. Your doctor will set treatment goals for you. Generally, you should have these blood sugar levels:  Before meals (preprandial): 80-130 mg/dL (4.3-1.5 mmol/L).  After meals (postprandial): below 180 mg/dL (10 mmol/L).  A1c level: less than 7%. Write down the times that you will check your blood sugar levels:  What do I need to know about low blood sugar? Low blood sugar is called hypoglycemia. This is when blood sugar is at or below 70 mg/dL (3.9 mmol/L). Symptoms may include:  Feeling: ? Hungry. ? Worried or nervous (anxious). ? Sweaty and clammy. ? Confused. ? Dizzy. ? Sleepy. ? Sick to your stomach (nauseous).  Having: ? A fast heartbeat. ? A headache. ? A change in your vision. ? Tingling or no feeling (numbness) around the mouth, lips, or tongue. ? Jerky movements that you cannot control (seizure).  Having trouble with: ? Moving (coordination). ? Sleeping. ? Passing out (fainting). ? Getting upset easily (irritability). Treating low blood sugar To treat low blood sugar, eat or drink something sugary right away. If you can think clearly and swallow safely, follow the 15:15 rule:  Take 15 grams of a fast-acting carb (carbohydrate). Talk with your doctor about how much you should take.  Some fast-acting carbs are: ? Sugar tablets (glucose pills). Take 3-4 glucose pills. ? 6-8 pieces of hard candy. ? 4-6 oz (120-150 mL) of fruit juice. ? 4-6 oz (120-150 mL) of regular (not diet) soda. ? 1 Tbsp (15 mL) honey or sugar.  Check your blood sugar 15 minutes after you take the carb.  If your blood sugar is still at or below 70 mg/dL (3.9 mmol/L), take 15 grams of a carb again.  If your blood sugar does not go above 70 mg/dL (3.9 mmol/L) after 3 tries, get help right away.  After your blood sugar goes back to normal, eat a meal or a snack within 1 hour. Treating very low blood sugar If your  blood sugar is at or below 54 mg/dL (3 mmol/L), you have very low blood sugar (severe hypoglycemia). This is an emergency. Do not wait to see if the symptoms will go away. Get medical help right away. Call your local emergency services (911 in the [place] Do not drive yourself to the hospital. Questions to ask your health care provider  Do I need to meet with a diabetes educator?  What equipment will I need to care for  myself at home?  What diabetes medicines do I need? When should I take them?  How often do I need to check my blood sugar?  What number can I call if I have questions?  When is my next doctor's visit?  Where can I find a support group for people with diabetes? Where to find more information  American Diabetes Association: www.diabetes.org  American Association of Diabetes Educators: www.diabeteseducator.org/patient-resources Contact a doctor if:  Your blood sugar is at or above 240 mg/dL (73.7 mmol/L) for 2 days in a row.  You have been sick or have had a fever for 2 days or more, and you are not getting better.  You have any of these problems for more than 6 hours: ? You cannot eat or drink. ? You feel sick to your stomach (nauseous). ? You throw up (vomit). ? You have watery poop (diarrhea). Get help right away if:  Your blood sugar is lower than 54 mg/dL (3 mmol/L).  You get confused.  You have trouble: ? Thinking clearly. ? Breathing.

## 2019-06-20 ENCOUNTER — Encounter: Payer: Self-pay | Admitting: Family Medicine

## 2019-08-15 ENCOUNTER — Encounter: Payer: Managed Care, Other (non HMO) | Attending: Family Medicine

## 2019-08-26 ENCOUNTER — Ambulatory Visit (HOSPITAL_BASED_OUTPATIENT_CLINIC_OR_DEPARTMENT_OTHER): Payer: Managed Care, Other (non HMO) | Attending: Family Medicine | Admitting: Cardiology

## 2019-08-26 ENCOUNTER — Other Ambulatory Visit: Payer: Self-pay

## 2019-08-26 DIAGNOSIS — G4730 Sleep apnea, unspecified: Secondary | ICD-10-CM

## 2019-08-26 DIAGNOSIS — R0683 Snoring: Secondary | ICD-10-CM | POA: Insufficient documentation

## 2019-08-26 DIAGNOSIS — G4736 Sleep related hypoventilation in conditions classified elsewhere: Secondary | ICD-10-CM | POA: Diagnosis not present

## 2019-08-26 DIAGNOSIS — G4733 Obstructive sleep apnea (adult) (pediatric): Secondary | ICD-10-CM | POA: Diagnosis not present

## 2019-08-26 DIAGNOSIS — G4734 Idiopathic sleep related nonobstructive alveolar hypoventilation: Secondary | ICD-10-CM

## 2019-08-28 NOTE — Procedures (Signed)
   Patient Name: Derek Wang, Derek Wang Date: 08/26/2019 Gender: Male D.O.B: 12-13-1980 Age (years): 39 Referring Provider: Betty G [place] Height (inches): 76 Interpreting Physician: Armanda Magic MD, ABSM Weight (lbs): 270 RPSGT: [place] Sink BMI: 33 MRN: [id number] Neck Size: 17.50  CLINICAL INFORMATION Sleep Study Type: HST  Indication for sleep study: N/A  Epworth Sleepiness Score: 10  SLEEP STUDY TECHNIQUE A multi-channel overnight portable sleep study was performed. The channels recorded were: nasal airflow, thoracic respiratory movement, and oxygen saturation with a pulse oximetry. Snoring was also monitored.  MEDICATIONS Patient self administered medications include: N/A.  SLEEP ARCHITECTURE Patient was studied for 426.9 minutes. The sleep efficiency was 100.0 % and the patient was supine for 0%. The arousal index was 0.0 per hour.  RESPIRATORY PARAMETERS The overall AHI was 62.4 per hour, with a central apnea index of 0.0 per hour.  The oxygen nadir was 66% during sleep.  CARDIAC DATA Mean heart rate during sleep was 75.0 bpm.  IMPRESSIONS - Severe obstructive sleep apnea occurred during this study (AHI = 62.4/h). - No significant central sleep apnea occurred during this study (CAI = 0.0/h). - Severe oxygen desaturation was noted during this study (Min O2 = 66%). - Patient snored 36.7% during the sleep.  DIAGNOSIS - Obstructive Sleep Apnea (G47.33) - Nocturnal Hypoxemia (G47.36)  RECOMMENDATIONS - Recommend in lab CPAP titration due to the severity of sleep disordered breathing.  - Avoid alcohol, sedatives and other CNS depressants that may worsen sleep apnea and disrupt normal sleep architecture. - Sleep hygiene should be reviewed to assess factors that may improve sleep quality. - Weight management and regular exercise should be initiated or continued.  [Electronically signed] 08/28/2019 07:15 PM  Armanda Magic MD, ABSM Diplomate, American Board of  Sleep Medicine

## 2019-09-03 ENCOUNTER — Telehealth: Payer: Self-pay | Admitting: *Deleted

## 2019-09-03 NOTE — Telephone Encounter (Signed)
-----   Message from Quintella Reichert, MD sent at 08/28/2019  7:17 PM EDT ----- Please let patient know that they have sleep apnea and recommend CPAP titration. Please set up titration in the sleep lab.

## 2019-09-03 NOTE — Telephone Encounter (Signed)
Informed patient of sleep study results and patient understanding was verbalized. Patient understandshissleep study showed they have sleep apnea and recommend CPAP titration. Please set up titration in the sleep lab. Titration sent to sleep pool. Pt is aware and agreeable tohisresults. 

## 2019-09-09 ENCOUNTER — Telehealth: Payer: Self-pay | Admitting: *Deleted

## 2019-09-09 NOTE — Telephone Encounter (Signed)
PA  Request for CPAP titration faxed to [facility]. @ [phone].

## 2019-09-13 ENCOUNTER — Telehealth: Payer: Self-pay | Admitting: *Deleted

## 2019-09-13 DIAGNOSIS — G4733 Obstructive sleep apnea (adult) (pediatric): Secondary | ICD-10-CM

## 2019-09-13 NOTE — Telephone Encounter (Signed)
Order ResMed CPAP with heated humidity and mask of choice on auto from 4 to 20cm H2O.  Get a download in [redacted] weeks along with an overnight pulse ox on CPAP.  Followup with me in 8 weeks

## 2019-09-13 NOTE — Telephone Encounter (Signed)
In lab cpap titration denied. Can do APAP.

## 2019-09-16 NOTE — Telephone Encounter (Signed)
Order placed to [facility] via fax  Upon patient request [facility]ME selection [facility] Patient understands he will be contacted by [facility] to set up his cpap. Patient understands to call if [facility] does not contact him with new setup in a timely manner. Patient understands they will be called once confirmation has been received from [facility] that they have received their new machine to schedule 10 week follow up appointment.  [facility] notified of new cpap order  Please add to airview Patient was grateful for the call and thanked me.Order placed to [facility] via fax.

## 2019-10-08 ENCOUNTER — Ambulatory Visit: Payer: Managed Care, Other (non HMO) | Admitting: Family Medicine

## 2019-10-20 NOTE — Progress Notes (Deleted)
HPI:   [name] is a 39 y.o. male, who is here today for *** months follow up.   *** was last seen on ***  {4+ HPI elements (or status of 3 or more chronic diseases)} *** DM II: *** Dx'ed in 06/2019. Negative for abdominal pain, nausea,vomiting, polydipsia,polyuria, or polyphagia.  Lab Results  Component Value Date   HGBA1C 6.7 (A) 06/18/2019   Lab Results  Component Value Date   MICROALBUR 4.0 (H) 06/18/2019   HLD: *** He is on Lipitor 10 mg daily.  Lab Results  Component Value Date   CHOL 180 04/12/2019   HDL 33.40 (L) 04/12/2019   LDLCALC 123 (H) 04/12/2019   TRIG 117.0 04/12/2019   CHOLHDL 5 04/12/2019    Review of Systems Rest of ROS, see pertinent positives sand negatives in HPI [review of 2 to 9 systems] ***  Current Outpatient Medications on File Prior to Visit  Medication Sig Dispense Refill  . atorvastatin (LIPITOR) 10 MG tablet Take 1 tablet (10 mg total) by mouth daily. 90 tablet 3  . fluticasone (FLONASE) 50 MCG/ACT nasal spray Place 2 sprays into both nostrils daily. 16 g 6  . montelukast (SINGULAIR) 10 MG tablet Take 1 tablet (10 mg total) by mouth at bedtime. 30 tablet 3  . triamcinolone (NASACORT) 55 MCG/ACT AERO nasal inhaler Place 2 sprays into the nose daily. (Patient not taking: Reported on 06/18/2019) 1 Inhaler 2   No current facility-administered medications on file prior to visit.   Past Medical History:  Diagnosis Date  . Allergy   . DVT (deep venous thrombosis) (HCC) 2016  . Hyperlipidemia    No Known Allergies  Social History   Socioeconomic History  . Marital status: Single    Spouse name: Not on file  . Number of children: Not on file  . Years of education: Not on file  . Highest education level: Not on file  Occupational History  . Not on file  Tobacco Use  . Smoking status: Former Games developer  . Smokeless tobacco: Never Used  Substance and Sexual Activity  . Alcohol use: Not Currently  . Drug use:  Never  . Sexual activity: Yes    Partners: Female  Other Topics Concern  . Not on file  Social History Narrative  . Not on file   Social Determinants of Health   Financial Resource Strain:   . Difficulty of Paying Living Expenses: Not on file  Food Insecurity:   . Worried About Programme researcher, broadcasting/film/video in the Last Year: Not on file  . Ran Out of Food in the Last Year: Not on file  Transportation Needs:   . Lack of Transportation (Medical): Not on file  . Lack of Transportation (Non-Medical): Not on file  Physical Activity:   . Days of Exercise per Week: Not on file  . Minutes of Exercise per Session: Not on file  Stress:   . Feeling of Stress : Not on file  Social Connections:   . Frequency of Communication with Friends and Family: Not on file  . Frequency of Social Gatherings with Friends and Family: Not on file  . Attends Religious Services: Not on file  . Active Member of Clubs or Organizations: Not on file  . Attends Banker Meetings: Not on file  . Marital Status: Not on file   There were no vitals filed for this visit.  Wt Readings from Last 3 Encounters:  08/26/19 (!) 270  lb (122.5 kg)  06/18/19 270 lb 3.2 oz (122.6 kg)  04/12/19 271 lb (122.9 kg)    There is no height or weight on file to calculate BMI.  Physical Exam  {[12+ exam elements]} ***  ASSESSMENT AND PLAN:   [name] was seen today for *** months follow-up.  No orders of the defined types were placed in this encounter.   No problem-specific Assessment & Plan notes found for this encounter.    No follow-ups on file.        -[name] was advised to return sooner than planned today if new concerns arise.       Derek Wang G. [place], MD  [facility]. [place] office.

## 2019-10-21 ENCOUNTER — Ambulatory Visit: Payer: Managed Care, Other (non HMO) | Admitting: Family Medicine

## 2022-04-13 ENCOUNTER — Encounter: Payer: Self-pay | Admitting: Family

## 2022-04-13 ENCOUNTER — Ambulatory Visit: Payer: BC Managed Care – PPO | Admitting: Family

## 2022-04-13 VITALS — BP 158/107 | HR 87 | Temp 97.6°F | Ht 73.0 in | Wt 254.2 lb

## 2022-04-13 DIAGNOSIS — I10 Essential (primary) hypertension: Secondary | ICD-10-CM | POA: Diagnosis not present

## 2022-04-13 DIAGNOSIS — J3089 Other allergic rhinitis: Secondary | ICD-10-CM

## 2022-04-13 MED ORDER — FLUTICASONE PROPIONATE 50 MCG/ACT NA SUSP: 2.0000 | Freq: Every day | NASAL | 6 refills | Status: AC

## 2022-04-13 MED ORDER — AMLODIPINE BESYLATE 5 MG PO TABS: 5.0000 mg | ORAL_TABLET | Freq: Every day | ORAL | 2 refills | Status: DC

## 2022-04-13 NOTE — Progress Notes (Signed)
Patient ID: Derek Wang, male    DOB: February 19, 1980, 42 y.o.   MRN: [id number]  Chief Complaint  Patient presents with   Hypertension    Pt states his BP was high at home so he went to [employer] to check and it was 184/114. 140/108 today at home. Father has HTN.     HPI: Hypertension: Patient is currently maintained on the following medications for blood pressure: None - new DX -first told his BP was high when he had the flu back in Dec? so he started checking randomly on his own and noticed it was very high again, went to UC this am & was advised to see his PCP today. Patient denies chest pain, headaches, fatigue, shortness of breath or swelling. Last 3 blood pressure readings in our office are as follows: BP Readings from Last 3 Encounters:  04/13/22 (!) 158/107  06/18/19 140/90  04/12/19 136/80   Allergic rhinitis:  pt reports hx of using steroid spray. Never able to get Nasonex or Nasacort through his insurance - did not cover it. Nasacort was expensive. He is not doing nasal irrigations. Zyrtec and Singulair have helped. Requesting RX for Flonase today.   Assessment & Plan:  Primary hypertension Assessment & Plan: New starting Amlodipine '5mg'$  qd, advised pt on use & SE advised pt on low sodium diet, increased water intake, and increasing exercise as many days as able, at least 20 min of cardio pt reports he has an upcoming f/u with his PCP  Orders: -     amLODIPine Besylate; Take 1 tablet (5 mg total) by mouth daily.  Dispense: 30 tablet; Refill: 2  Non-seasonal allergic rhinitis, unspecified trigger -     Fluticasone Propionate; Place 2 sprays into both nostrils daily.  Dispense: 16 g; Refill: 6   Subjective:    Outpatient Medications Prior to Visit  Medication Sig Dispense Refill   montelukast (SINGULAIR) 10 MG tablet Take 1 tablet (10 mg total) by mouth at bedtime. 30 tablet 3   fluticasone (FLONASE) 50 MCG/ACT nasal spray Place 2 sprays into both nostrils daily.  16 g 6   triamcinolone (NASACORT) 55 MCG/ACT AERO nasal inhaler Place 2 sprays into the nose daily. 1 Inhaler 2   atorvastatin (LIPITOR) 10 MG tablet Take 1 tablet (10 mg total) by mouth daily. (Patient not taking: Reported on 04/13/2022) 90 tablet 3   No facility-administered medications prior to visit.   Past Medical History:  Diagnosis Date   Allergy    DVT (deep venous thrombosis) ([place]) 2016   Hyperlipidemia    History reviewed. No pertinent surgical history. No Known Allergies    Objective:    Physical Exam Vitals and nursing note reviewed.  Constitutional:      General: He is not in acute distress.    Appearance: Normal appearance.  HENT:     Head: Normocephalic.     Right Ear: Tympanic membrane and ear canal normal.     Left Ear: Tympanic membrane and ear canal normal.     Nose: Congestion and rhinorrhea present.     Mouth/Throat:     Mouth: Mucous membranes are moist.     Pharynx: Oropharyngeal exudate present. No pharyngeal swelling, posterior oropharyngeal erythema or uvula swelling.     Tonsils: No tonsillar exudate or tonsillar abscesses.  Cardiovascular:     Rate and Rhythm: Normal rate and regular rhythm.  Pulmonary:     Effort: Pulmonary effort is normal.     Breath sounds: Normal  breath sounds.  Musculoskeletal:        General: Normal range of motion.     Cervical back: Normal range of motion.  Skin:    General: Skin is warm and dry.  Neurological:     Mental Status: He is alert and oriented to person, place, and time.  Psychiatric:        Mood and Affect: Mood normal.    BP (!) 158/107 (BP Location: Left Arm, Patient Position: Sitting, Cuff Size: Large)   Pulse 87   Temp 97.6 F (36.4 C) (Temporal)   Ht '6\' 1"'$  (1.854 m)   Wt 254 lb 4 oz (115.3 kg)   SpO2 99%   BMI 33.54 kg/m  Wt Readings from Last 3 Encounters:  04/13/22 254 lb 4 oz (115.3 kg)  08/26/19 (!) 270 lb (122.5 kg)  06/18/19 270 lb 3.2 oz (122.6 kg)       Jeanie Sewer,  NP

## 2022-04-13 NOTE — Assessment & Plan Note (Addendum)
New starting Amlodipine '5mg'$  qd, advised pt on use & SE advised pt on low sodium diet, increased water intake, and increasing exercise as many days as able, at least 20 min of cardio pt reports he has an upcoming f/u with his PCP

## 2022-04-20 NOTE — Progress Notes (Signed)
Chief Complaint  Patient presents with   Follow-up   HPI: [name] is a 42 y.o. male, who is here today for chronic disease management. Last seen on 06/18/19.  Recently Dx'ed with HTN He reports high blood pressure reading when he checked BP at [facility], 163/117., so went to [facility] for BP check. BP 186/114 mmHg taken at [facility] on March 13th. Since commencing Amlodipine 5 mg daily 10 days ago, there has been a noted decrease in blood pressure, with a recent measurement of 130's/880's mmHg. He denies severe/frequent headache, visual changes, chest pain, dyspnea, palpitation, focal weakness, or edema. Lab Results  Component Value Date   CREATININE 0.88 04/12/2019   BUN 11 04/12/2019   NA 137 04/12/2019   K 4.1 04/12/2019   CL 103 04/12/2019   CO2 29 04/12/2019   He has been exercising regularly for the past 2 weeks, with gym attendance at least three times a week for the past two weeks, focusing on weight loss activities and utilizing the elliptical machine.   DM II:Dx'ed in 06/2019 with a HgA1C 6.7. He has been monitoring his BS's for the past couple weeks and a highest blood sugar reading of 206. Following dietary modifications, He now reports morning blood sugar levels averaging around 140. He is on nonpharmacologic treatment. Eye exam: 2023. Foot exam: 2021. Negative for symptoms of hypoglycemia, polyuria, polydipsia, numbness extremities, foot ulcers/trauma  Lab Results  Component Value Date   HGBA1C 6.7 (A) 06/18/2019   Lab Results  Component Value Date   MICROALBUR 4.0 (H) 06/18/2019   Hyperlipidemia: He is no longer taking atorvastatin 10 mg daily. Lab Results  Component Value Date   CHOL 180 04/12/2019   HDL 33.40 (L) 04/12/2019   LDLCALC 123 (H) 04/12/2019   TRIG 117.0 04/12/2019   CHOLHDL 5 04/12/2019   He has observed a "spot" under his right eyebrow for about a month, which has not been causing any discomfort or pruritus.It seems like size  has decreased some.  He also noted the appearance of a blister on his upper lip upon waking up today, an occurrence not experienced in seven years. The denies any history of genital lesions similar to the lip blister. He has not identified exacerbating or alleviating factors.  Inquiring about prostate cancer screening. Denies urinary symptoms or Fhx of prostate cancer.  Review of Systems  Constitutional:  Negative for chills and fever.  HENT:  Negative for nosebleeds and sore throat.   Respiratory:  Negative for cough and wheezing.   Gastrointestinal:  Negative for abdominal pain, nausea and vomiting.  Genitourinary:  Negative for decreased urine volume, dysuria and hematuria.  Musculoskeletal:  Negative for gait problem and myalgias.  Neurological:  Negative for syncope and facial asymmetry.  See other pertinent positives and negatives in HPI.  Current Outpatient Medications on File Prior to Visit  Medication Sig Dispense Refill   fluticasone (FLONASE) 50 MCG/ACT nasal spray Place 2 sprays into both nostrils daily. 16 g 6   No current facility-administered medications on file prior to visit.   Past Medical History:  Diagnosis Date   Allergy    DVT (deep venous thrombosis) ([place]) 2016   Hyperlipidemia    No Known Allergies  Social History   Socioeconomic History   Marital status: Single    Spouse name: Not on file   Number of children: Not on file   Years of education: Not on file   Highest education level: Not on file  Occupational  History   Not on file  Tobacco Use   Smoking status: Former   Smokeless tobacco: Never  Substance and Sexual Activity   Alcohol use: Not Currently   Drug use: Never   Sexual activity: Yes    Partners: Female  Other Topics Concern   Not on file  Social History Narrative   Not on file   Social Determinants of Health   Financial Resource Strain: Not on file  Food Insecurity: Not on file  Transportation Needs: Not on file  Physical  Activity: Not on file  Stress: Not on file  Social Connections: Not on file   Today's Vitals   04/22/22 0705  BP: 138/86  Pulse: 100  Resp: 12  Temp: 98.1 F (36.7 C)  TempSrc: Oral  SpO2: 96%  Weight: 250 lb 8 oz (113.6 kg)  Height: 6\' 1"  (1.854 m)   Wt Readings from Last 3 Encounters:  04/22/22 250 lb 8 oz (113.6 kg)  04/13/22 254 lb 4 oz (115.3 kg)  08/26/19 (!) 270 lb (122.5 kg)   Body mass index is 33.05 kg/m.  Physical Exam Vitals and nursing note reviewed.  Constitutional:      General: He is not in acute distress.    Appearance: He is well-developed.  HENT:     Head: Normocephalic and atraumatic.      Mouth/Throat:     Mouth: Mucous membranes are moist.   Eyes:     Conjunctiva/sclera: Conjunctivae normal.  Cardiovascular:     Rate and Rhythm: Normal rate and regular rhythm.     Pulses:          Dorsalis pedis pulses are 2+ on the right side and 2+ on the left side.     Heart sounds: No murmur heard. Pulmonary:     Effort: Pulmonary effort is normal. No respiratory distress.     Breath sounds: Normal breath sounds.  Abdominal:     Palpations: Abdomen is soft. There is no hepatomegaly or mass.     Tenderness: There is no abdominal tenderness.  Lymphadenopathy:     Cervical: No cervical adenopathy.  Skin:    General: Skin is warm.     Findings: No erythema or rash.  Neurological:     Mental Status: He is alert and oriented to person, place, and time.     Cranial Nerves: No cranial nerve deficit.     Gait: Gait normal.  Psychiatric:        Mood and Affect: Mood and affect normal.    Diabetic Foot Exam - Simple   Simple Foot Form Diabetic Foot exam was performed with the following findings: Yes 04/22/2022  7:37 AM  Visual Inspection See comments: Yes Sensation Testing Intact to touch and monofilament testing bilaterally: Yes Pulse Check Posterior Tibialis and Dorsalis pulse intact bilaterally: Yes Comments Bunions bilateral and small plantar  calluses.    ASSESSMENT AND PLAN:  [name] was seen today for follow-up.  Diagnoses and all orders for this visit: Lab Results  Component Value Date   MICROALBUR 0.9 04/22/2022   MICROALBUR 4.0 (H) 06/18/2019   Lab Results  Component Value Date   CREATININE 1.02 04/22/2022   BUN 17 04/22/2022   NA 139 04/22/2022   K 4.2 04/22/2022   CL 104 04/22/2022   CO2 27 04/22/2022   Lab Results  Component Value Date   ALT 17 04/22/2022   AST 19 04/22/2022   ALKPHOS 84 04/22/2022   BILITOT 0.5 04/22/2022  Lab Results  Component Value Date   CHOL 160 04/22/2022   HDL 32.00 (L) 04/22/2022   LDLCALC 115 (H) 04/22/2022   TRIG 64.0 04/22/2022   CHOLHDL 5 04/22/2022   Type 2 diabetes mellitus with other specified complication, without long-term current use of insulin [facility]) Assessment & Plan: HgA1C was at goal in 06/2019. Continue nonpharmacologic treatment. Further recommendations will be given according to HgA1C result. Regular exercise and healthy diet with avoidance of added sugar food intake is an important part of treatment and recommended. Annual eye exam, periodic dental and foot care recommended. F/U in 4 months.  Orders: -     Microalbumin / creatinine urine ratio; Future -     Comprehensive metabolic panel; Future -     Hemoglobin A1c; Future  Encounter for HCV screening test for low risk patient -     Hepatitis C antibody; Future  Hyperlipidemia associated with type 2 diabetes mellitus ([place]) Assessment & Plan: He has not been taking atorvastatin. We discussed CV benefits of statin medications. Further recommendation will be given according to lipid panel results.  Orders: -     Lipid panel; Future  Mass of soft tissue of face We discussed possible etiologies. Most likely benign, ? Sebaceous cyst. He would like lesion to be removed, referral to plastic surgeon placed.  -     Ambulatory referral to Plastic Surgery  Recurrent herpes labialis Assessment  & Plan: We discussed diagnosis, prognosis, and treatment options. Plan stress 500 mg twice daily for 3 days recommended.  He can repeat treatment for future outbreaks.  Orders: -     valACYclovir HCl; 1 tab bid x 3 days and repeat treatment for further flare ups as soon as lesion appears.  Dispense: 6 tablet; Refill: 2  Primary hypertension Assessment & Plan: BP still mildly elevated. He has been on Amlodipine 5 mg for 10 days, so no changes today. He was instructed to let me know if BP is not <=130/80, in which case we will consider adding a ACE or ARB. Continue monitoring BP regularly. Eye exam is current.  Orders: -     amLODIPine Besylate; Take 1 tablet (5 mg total) by mouth daily.  Dispense: 90 tablet; Refill: 1  I spent a total of 41 minutes in both face to face and non face to face activities for this visit on the date of this encounter. During this time history was obtained and documented, examination was performed, prior labs reviewed, and assessment/plan discussed. In regard to prostate cancer screening, we discussed current recommendations, it is not indicated at this time.  Return in about 6 months (around 10/23/2022) for CPE.  Shuree Brossart [place], MD [facility] [place] office.

## 2022-04-22 ENCOUNTER — Ambulatory Visit (INDEPENDENT_AMBULATORY_CARE_PROVIDER_SITE_OTHER): Payer: BC Managed Care – PPO | Admitting: Family Medicine

## 2022-04-22 ENCOUNTER — Other Ambulatory Visit: Payer: BC Managed Care – PPO

## 2022-04-22 ENCOUNTER — Telehealth: Payer: Self-pay | Admitting: Family Medicine

## 2022-04-22 ENCOUNTER — Encounter: Payer: Self-pay | Admitting: Family Medicine

## 2022-04-22 VITALS — BP 138/86 | HR 100 | Temp 98.1°F | Resp 12 | Ht 73.0 in | Wt 250.5 lb

## 2022-04-22 DIAGNOSIS — Z0000 Encounter for general adult medical examination without abnormal findings: Secondary | ICD-10-CM

## 2022-04-22 DIAGNOSIS — Z1159 Encounter for screening for other viral diseases: Secondary | ICD-10-CM | POA: Diagnosis not present

## 2022-04-22 DIAGNOSIS — M7989 Other specified soft tissue disorders: Secondary | ICD-10-CM | POA: Diagnosis not present

## 2022-04-22 DIAGNOSIS — B001 Herpesviral vesicular dermatitis: Secondary | ICD-10-CM

## 2022-04-22 DIAGNOSIS — E785 Hyperlipidemia, unspecified: Secondary | ICD-10-CM | POA: Diagnosis not present

## 2022-04-22 DIAGNOSIS — J3089 Other allergic rhinitis: Secondary | ICD-10-CM

## 2022-04-22 DIAGNOSIS — E1169 Type 2 diabetes mellitus with other specified complication: Secondary | ICD-10-CM | POA: Insufficient documentation

## 2022-04-22 DIAGNOSIS — I10 Essential (primary) hypertension: Secondary | ICD-10-CM

## 2022-04-22 LAB — COMPREHENSIVE METABOLIC PANEL
ALT: 17 U/L (ref 0–53)
AST: 19 U/L (ref 0–37)
Albumin: 4.7 g/dL (ref 3.5–5.2)
Alkaline Phosphatase: 84 U/L (ref 39–117)
BUN: 17 mg/dL (ref 6–23)
CO2: 27 mEq/L (ref 19–32)
Calcium: 9.9 mg/dL (ref 8.4–10.5)
Chloride: 104 mEq/L (ref 96–112)
Creatinine, Ser: 1.02 mg/dL (ref 0.40–1.50)
GFR: 90.49 mL/min (ref >60.00)
Glucose, Bld: 132 mg/dL — ABNORMAL HIGH (ref 70–99)
Potassium: 4.2 mEq/L (ref 3.5–5.1)
Sodium: 139 mEq/L (ref 135–145)
Total Bilirubin: 0.5 mg/dL (ref 0.2–1.2)
Total Protein: 7.5 g/dL (ref 6.0–8.3)

## 2022-04-22 LAB — LIPID PANEL
Cholesterol: 160 mg/dL (ref 0–200)
HDL: 32 mg/dL — ABNORMAL LOW (ref >39.00)
LDL Cholesterol: 115 mg/dL — ABNORMAL HIGH (ref 0–99)
NonHDL: 128
Total CHOL/HDL Ratio: 5
Triglycerides: 64 mg/dL (ref 0.0–149.0)
VLDL: 12.8 mg/dL (ref 0.0–40.0)

## 2022-04-22 LAB — MICROALBUMIN / CREATININE URINE RATIO
Creatinine,U: 130 mg/dL
Microalb Creat Ratio: 0.7 mg/g (ref 0.0–30.0)
Microalb, Ur: 0.9 mg/dL (ref 0.0–1.9)

## 2022-04-22 MED ORDER — MONTELUKAST SODIUM 10 MG PO TABS: 10.0000 mg | ORAL_TABLET | Freq: Every day | ORAL | 3 refills | Status: AC

## 2022-04-22 MED ORDER — GLUCOSE BLOOD VI STRP: ORAL_STRIP | 12 refills | Status: DC

## 2022-04-22 MED ORDER — ACCU-CHEK GUIDE VI STRP: ORAL_STRIP | 12 refills | Status: AC

## 2022-04-22 MED ORDER — ATORVASTATIN CALCIUM 10 MG PO TABS: 10.0000 mg | ORAL_TABLET | Freq: Every day | ORAL | 3 refills | Status: DC

## 2022-04-22 MED ORDER — AMLODIPINE BESYLATE 5 MG PO TABS: 5.0000 mg | ORAL_TABLET | Freq: Every day | ORAL | 1 refills | Status: DC

## 2022-04-22 MED ORDER — VALACYCLOVIR HCL 500 MG PO TABS: ORAL_TABLET | ORAL | 2 refills | Status: DC

## 2022-04-22 MED ORDER — ACCU-CHEK SOFTCLIX LANCETS MISC: 12 refills | Status: AC

## 2022-04-22 NOTE — Assessment & Plan Note (Addendum)
BP still mildly elevated. He has been on Amlodipine 5 mg for 10 days, so no changes today. He was instructed to let me know if BP is not <=130/80, in which case we will consider adding a ACE or ARB. Continue monitoring BP regularly. Eye exam is current.

## 2022-04-22 NOTE — Telephone Encounter (Signed)
Rx sent 

## 2022-04-22 NOTE — Telephone Encounter (Signed)
Patient needs a refill in for his lancets for the AccuCheck Guide Glucose Monitor.  Pharmacy- [facility] in [place] on [address]

## 2022-04-22 NOTE — Telephone Encounter (Signed)
Prescription Request  04/22/2022  LOV: 04/22/2022  What is the name of the medication or equipment? montelukast (SINGULAIR) 10 MG tablet, atorvastatin (LIPITOR) 10 MG tablet . Pt would like 90 day supply  Have you contacted your pharmacy to request a refill? No   Which pharmacy would you like this sent to?  [id number] - [place], [address] [place] [address] Phone: [phone] Fax: [phone]    Patient notified that their request is being sent to the clinical staff for review and that they should receive a response within 2 business days.   Please advise at Mobile [phone] (mobile)

## 2022-04-22 NOTE — Telephone Encounter (Signed)
Rxs sent

## 2022-04-22 NOTE — Assessment & Plan Note (Signed)
We discussed diagnosis, prognosis, and treatment options. Plan stress 500 mg twice daily for 3 days recommended.  He can repeat treatment for future outbreaks.

## 2022-04-22 NOTE — Assessment & Plan Note (Addendum)
HgA1C was at goal in 06/2019. Continue nonpharmacologic treatment. Further recommendations will be given according to HgA1C result. Regular exercise and healthy diet with avoidance of added sugar food intake is an important part of treatment and recommended. Annual eye exam, periodic dental and foot care recommended. F/U in 4 months.

## 2022-04-22 NOTE — Assessment & Plan Note (Signed)
He has not been taking atorvastatin. We discussed CV benefits of statin medications. Further recommendation will be given according to lipid panel results.

## 2022-04-22 NOTE — Patient Instructions (Addendum)
A few things to remember from today's visit:  Type 2 diabetes mellitus with other specified complication, without long-term current use of insulin ([place]) - Plan: Microalbumin / creatinine urine ratio, Comprehensive metabolic panel, Hemoglobin A1c  Encounter for HCV screening test for low risk patient - Plan: Hepatitis C antibody  Hyperlipidemia associated with type 2 diabetes mellitus ([place]) - Plan: Lipid panel  Mass of soft tissue of face  Recurrent herpes labialis - Plan: valACYclovir (VALTREX) 500 MG tablet  Blood pressure still mildly elevated today, if in 2-3 weeks it is still above 130/80, please let me know. Low salt diet.  If you need refills for medications you take chronically, please call your pharmacy. Do not use My Chart to request refills or for acute issues that need immediate attention. If you send a my chart message, it may take a few days to be addressed, specially if I am not in the office.  Please be sure medication list is accurate. If a new problem present, please set up appointment sooner than planned today.

## 2022-04-23 LAB — HEMOGLOBIN A1C
Hgb A1c MFr Bld: 10.2 % of total Hgb — ABNORMAL HIGH (ref <5.7)
Mean Plasma Glucose: 246 mg/dL
eAG (mmol/L): 13.6 mmol/L

## 2022-04-25 ENCOUNTER — Other Ambulatory Visit: Payer: Self-pay

## 2022-04-25 LAB — HEPATITIS C ANTIBODY: Hepatitis C Ab: NONREACTIVE

## 2022-04-25 MED ORDER — METFORMIN HCL 500 MG PO TABS: 500.0000 mg | ORAL_TABLET | Freq: Two times a day (BID) | ORAL | 3 refills | Status: DC

## 2022-04-27 ENCOUNTER — Ambulatory Visit: Payer: Managed Care, Other (non HMO) | Admitting: Family Medicine

## 2022-06-08 ENCOUNTER — Encounter: Payer: Self-pay | Admitting: Plastic Surgery

## 2022-06-08 ENCOUNTER — Ambulatory Visit: Payer: BC Managed Care – PPO | Admitting: Plastic Surgery

## 2022-06-08 VITALS — BP 148/116 | HR 90 | Ht 73.0 in | Wt 261.4 lb

## 2022-06-08 DIAGNOSIS — L989 Disorder of the skin and subcutaneous tissue, unspecified: Secondary | ICD-10-CM | POA: Diagnosis not present

## 2022-06-08 NOTE — Progress Notes (Signed)
   Referring Provider [place], Betty G, MD [address] [place],  [place] [address]   CC:  Chief Complaint  Patient presents with   Consult           Derek Wang is an 42 y.o. male.  HPI: [name] is a 42 year old male who presents today for evaluation of a small cyst just below the right eyebrow.  Patient states this has been there for about 2 months and is increasing in size.  He would like to have it removed.  No Known Allergies  Outpatient Encounter Medications as of 06/08/2022  Medication Sig   Accu-Chek Softclix Lancets lancets Use to test 1-2 times daily.   amLODipine (NORVASC) 5 MG tablet Take 1 tablet (5 mg total) by mouth daily.   atorvastatin (LIPITOR) 10 MG tablet Take 1 tablet (10 mg total) by mouth daily.   fluticasone (FLONASE) 50 MCG/ACT nasal spray Place 2 sprays into both nostrils daily.   glucose blood (ACCU-CHEK GUIDE) test strip Use to test 1-2 times daily.   metFORMIN (GLUCOPHAGE) 500 MG tablet Take 1 tablet (500 mg total) by mouth 2 (two) times daily with a meal.   montelukast (SINGULAIR) 10 MG tablet Take 1 tablet (10 mg total) by mouth at bedtime.   valACYclovir (VALTREX) 500 MG tablet 1 tab bid x 3 days and repeat treatment for further flare ups as soon as lesion appears.   No facility-administered encounter medications on file as of 06/08/2022.     Past Medical History:  Diagnosis Date   Allergy    DVT (deep venous thrombosis) ([facility]) 2016   Hyperlipidemia     No past surgical history on file.  Family History  Problem Relation Age of Onset   Cancer Neg Hx     Social History   Social History Narrative   Not on file     Review of Systems General: Denies fevers, chills, weight loss CV: Denies chest pain, shortness of breath, palpitations Skin: Small subcutaneous mass just below the right eyebrow  Physical Exam    06/08/2022    9:32 AM 06/08/2022    8:49 AM 04/22/2022    7:05 AM  Vitals with BMI  Height  6\' 1"  6\' 1"    Weight  261 lbs 6 oz 250 lbs 8 oz  BMI  34.49 33.06  Systolic 148 148 213  Diastolic 116 104 86  Pulse  90 100    General:  No acute distress,  Alert and oriented, Non-Toxic, Normal speech and affect Integument: As noted he has a 1 cm x 1 cm freely mobile mass just below the right eyebrow.  The mass is most likely a sebaceous cyst.   Assessment/Plan Skin lesion: Patient is otherwise healthy and not taking any blood thinners.  Will schedule him for excision of the mass in the office.  He understands that there will be a scar.  I will orient this along with the lower order of the eyebrow to hide the scar is much as possible.  There is also a chance of recurrence and a chance of infection due to the etiology of the cyst.  Tissue will be sent to pathology for routine examination.  Derek Wang 06/08/2022, 9:45 AM

## 2022-06-09 ENCOUNTER — Ambulatory Visit: Payer: BC Managed Care – PPO | Admitting: Plastic Surgery

## 2022-06-09 ENCOUNTER — Encounter: Payer: Self-pay | Admitting: Plastic Surgery

## 2022-06-09 VITALS — BP 154/103 | HR 90

## 2022-06-09 DIAGNOSIS — L980 Pyogenic granuloma: Secondary | ICD-10-CM

## 2022-06-09 DIAGNOSIS — D489 Neoplasm of uncertain behavior, unspecified: Secondary | ICD-10-CM

## 2022-06-09 NOTE — Progress Notes (Signed)
Procedure Note  Preoperative Dx: Mass above right eye  Postoperative Dx: Same  Procedure: Excision of 1 cm mass  Anesthesia: Lidocaine 1% with 1:100,000 epinephrine and 0.25% Sensorcaine   Indication for Procedure: Removal for pathologic diagnosis  Description of Procedure: Risks and complications were explained to the patient including scarring and need for additional surgery.  Consent was confirmed and the patient understands the risks and benefits.  The potential complications and alternatives were explained and the patient consents.  The patient expressed understanding the option of not having the procedure and the risks of a scar.  Time out was called and all information was confirmed to be correct.    The area was prepped and drapped.  Local anesthetic was injected in the subcutaneous tissues.  After waiting for the local to take affect a 1 cm incision was made over the mass.  I anticipated finding a sebaceous cyst however there is no evidence of the cyst at the site of the mass.  There was some atypical fat which was excised.  This may have represented a previously organized hematoma or damage from trauma to the area.  After obtaining hemostasis, the surgical wound was closed with interrupted 5-0 Prolene sutures.  The surgical wound measured 1 cm.  A dressing was applied.  The patient was given instructions on how to care for the area and a follow up appointment.  Arkell tolerated the procedure well and there were no complications. The specimen was sent to pathology.

## 2022-06-13 NOTE — Progress Notes (Deleted)
Patient is a pleasant 42 year old male with PMH of right eyebrow lesion s/p excision performed 06/09/2022 by Dr. Ladona Ridgel who presents to clinic for postprocedural follow-up.  Reviewed pathology and unfortunately has not yet resulted.  The excision site was closed with 5-0 interrupted Prolene sutures.  Today,

## 2022-06-14 ENCOUNTER — Ambulatory Visit: Payer: BC Managed Care – PPO | Admitting: Physician Assistant

## 2022-06-15 ENCOUNTER — Encounter: Payer: Self-pay | Admitting: Plastic Surgery

## 2022-06-15 ENCOUNTER — Ambulatory Visit: Payer: BC Managed Care – PPO | Admitting: Surgical

## 2022-06-15 VITALS — BP 139/92 | HR 96

## 2022-06-15 DIAGNOSIS — Z9889 Other specified postprocedural states: Secondary | ICD-10-CM

## 2022-06-15 DIAGNOSIS — L989 Disorder of the skin and subcutaneous tissue, unspecified: Secondary | ICD-10-CM

## 2022-06-15 DIAGNOSIS — D489 Neoplasm of uncertain behavior, unspecified: Secondary | ICD-10-CM

## 2022-06-15 NOTE — Progress Notes (Signed)
Patient is a 42 year old male here for follow-up after excision of right eyebrow mass with Dr. Ladona Ridgel on 06/09/2022.  He is 6 days postop.  He reports he is doing well.  He is not having any infectious symptoms.  He feels as if he has noticed the area looks smaller.    Pathology not yet resulted.  On exam right eyebrow incision is intact and healing well.  There is some surrounding scabbing noted.  There is no dehiscence noted.  3 interrupted Prolene sutures are noted. There is no erythema or cellulitic changes.  No active drainage noted.  A/P:  3 Prolene interrupted sutures were removed.  Patient tolerated this well. There is no signs of infection or concern on exam.  We discussed the pathology has not yet resulted, once results are available, we will notify patient if there is any abnormal results.  He will also have access to the results via MyChart.  Patient was understanding and agreeable to this.  We discussed starting light massage to the area in 1 week, keeping the area covered with sunscreen.  Discussed follow-up as needed.  Discussed that he is at high risk of healing complications due to his A1c being 10.2 and to notify us if he has any issues.

## 2022-07-20 ENCOUNTER — Ambulatory Visit: Payer: BC Managed Care – PPO | Admitting: Plastic Surgery

## 2022-07-26 ENCOUNTER — Ambulatory Visit: Payer: BC Managed Care – PPO | Admitting: Family Medicine

## 2022-07-29 ENCOUNTER — Telehealth: Payer: Self-pay | Admitting: Family Medicine

## 2022-07-29 DIAGNOSIS — I10 Essential (primary) hypertension: Secondary | ICD-10-CM

## 2022-07-29 MED ORDER — AMLODIPINE BESYLATE 5 MG PO TABS: 10.0000 mg | ORAL_TABLET | Freq: Every day | ORAL | 1 refills | Status: DC

## 2022-07-29 NOTE — Addendum Note (Signed)
Addended by: Kathreen Devoid on: 07/29/2022 02:39 PM   Modules accepted: Orders

## 2022-07-29 NOTE — Progress Notes (Unsigned)
HPI: [name] is a 42 y.o. male, who is here today for chronic disease management.  Last seen on 04/22/22 No new problems since his last visit.  Diabetes Mellitus II: Diagnosed on 06/18/2019 with a hemoglobin A1c of 6.7. Currently on metformin 5 mg twice daily.  In general he has tolerated medication well, mentions occasional softer stools but not bad enough to discontinue medication. - Checking BG at home: 120's. He admits to struggling with his diet and has gained weight. He is not exercising regularly. He has not had his eye exam. Foot exam: 04/2022. - Negative for symptoms of hypoglycemia, polyuria, polydipsia, numbness extremities, foot ulcers/trauma  Lab Results  Component Value Date   HGBA1C 10.2 (H) 04/22/2022   Lab Results  Component Value Date   MICROALBUR 0.9 04/22/2022   Hypertension: He reports that his blood pressure has been elevated despite doubling up on his Amlodipine about a month ago. Currently he is on amlodipine 10 mg daily. BP's 130's-140/90-100's Negative for unusual or severe headache, visual changes, exertional chest pain, dyspnea,  focal weakness, or edema. He was diagnosed with sleep apnea in 2018 but has not been using CPAP.   Lab Results  Component Value Date   CREATININE 1.02 04/22/2022   BUN 17 04/22/2022   NA 139 04/22/2022   K 4.2 04/22/2022   CL 104 04/22/2022   CO2 27 04/22/2022   Hyperlipidemia: Currently he is on atorvastatin 10 mg daily. Lab Results  Component Value Date   CHOL 160 04/22/2022   HDL 32.00 (L) 04/22/2022   LDLCALC 115 (H) 04/22/2022   TRIG 64.0 04/22/2022   CHOLHDL 5 04/22/2022   Review of Systems  Constitutional:  Positive for fatigue. Negative for chills and fever.  Respiratory:  Negative for cough and wheezing.   Gastrointestinal:  Negative for abdominal pain, nausea and vomiting.  Genitourinary:  Negative for decreased urine volume, dysuria and hematuria.  Skin:  Negative for rash.   Neurological:  Negative for syncope and facial asymmetry.  See other pertinent positives and negatives in HPI.  Current Outpatient Medications on File Prior to Visit  Medication Sig Dispense Refill   Accu-Chek Softclix Lancets lancets Use to test 1-2 times daily. 100 each 12   atorvastatin (LIPITOR) 10 MG tablet Take 1 tablet (10 mg total) by mouth daily. 90 tablet 3   fluticasone (FLONASE) 50 MCG/ACT nasal spray Place 2 sprays into both nostrils daily. 16 g 6   glucose blood (ACCU-CHEK GUIDE) test strip Use to test 1-2 times daily. 100 each 12   montelukast (SINGULAIR) 10 MG tablet Take 1 tablet (10 mg total) by mouth at bedtime. 90 tablet 3   valACYclovir (VALTREX) 500 MG tablet 1 tab bid x 3 days and repeat treatment for further flare ups as soon as lesion appears. 6 tablet 2   No current facility-administered medications on file prior to visit.   Past Medical History:  Diagnosis Date   Allergy    DVT (deep venous thrombosis) (HCC) 2016   Hyperlipidemia    No Known Allergies  Social History   Socioeconomic History   Marital status: Single    Spouse name: Not on file   Number of children: Not on file   Years of education: Not on file   Highest education level: Not on file  Occupational History   Not on file  Tobacco Use   Smoking status: Former   Smokeless tobacco: Never  Substance and Sexual Activity   Alcohol use:  Not Currently   Drug use: Never   Sexual activity: Yes    Partners: Female  Other Topics Concern   Not on file  Social History Narrative   Not on file   Social Determinants of Health   Financial Resource Strain: Not on file  Food Insecurity: Not on file  Transportation Needs: Not on file  Physical Activity: Not on file  Stress: Not on file  Social Connections: Not on file   Vitals:   08/01/22 0730  BP: (!) 138/100  Pulse: 85  Resp: 12  Temp: 98.5 F (36.9 C)  SpO2: 96%   Wt Readings from Last 3 Encounters:  08/01/22 263 lb 2 oz (119.4  kg)  06/08/22 261 lb 6.4 oz (118.6 kg)  04/22/22 250 lb 8 oz (113.6 kg)  Body mass index is 34.72 kg/m.  Physical Exam Vitals and nursing note reviewed.  Constitutional:      General: He is not in acute distress.    Appearance: He is well-developed.  HENT:     Head: Normocephalic and atraumatic.     Mouth/Throat:     Mouth: Mucous membranes are moist.     Pharynx: Oropharynx is clear.  Eyes:     Conjunctiva/sclera: Conjunctivae normal.  Cardiovascular:     Rate and Rhythm: Normal rate and regular rhythm.     Pulses:          Dorsalis pedis pulses are 2+ on the right side and 2+ on the left side.     Heart sounds: No murmur heard.    Comments: Trace pitting lE edema, bilateral. Pulmonary:     Effort: Pulmonary effort is normal. No respiratory distress.     Breath sounds: Normal breath sounds.  Abdominal:     Palpations: Abdomen is soft. There is no hepatomegaly or mass.     Tenderness: There is no abdominal tenderness.  Skin:    General: Skin is warm.     Findings: No erythema or rash.  Neurological:     Mental Status: He is alert and oriented to person, place, and time.     Cranial Nerves: No cranial nerve deficit.     Gait: Gait normal.  Psychiatric:        Mood and Affect: Mood and affect normal.   ASSESSMENT AND PLAN:  [name] was seen today for medical management of chronic issues.  Diagnoses and all orders for this visit: Lab Results  Component Value Date   HGBA1C 8.1 (A) 08/01/2022   Type 2 diabetes mellitus with other specified complication, without long-term current use of insulin (HCC) Assessment & Plan: Comorbidities: Hypertension, obesity, and hyperlipidemia. Hemoglobin A1c is not at goal, it went from 10.2 to 8.1. We discussed options, he prefers not to add new medications. Increase metformin dose from 500 mg twice daily to 1000 mg twice daily with meals.  We reviewed some side effects. HgA1C was at goal in 06/2019. Regular exercise and healthy  diet with avoidance of added sugar food intake is an important part of treatment and recommended. Annual eye exam, periodic dental and foot care recommended. F/U in 3-4 months.  Orders: -     POCT glycosylated hemoglobin (Hb A1C) -     metFORMIN HCl; Take 2 tablets (1,000 mg total) by mouth 2 (two) times daily with a meal.  Dispense: 360 tablet; Refill: 2 -     Basic metabolic panel; Future  Primary hypertension Assessment & Plan: BP is not adequately controlled. We reviewed possible  complications of hypertension. Continue amlodipine 10 mg daily. Olmesartan 20 mg added today. Continue monitoring BP regularly. We discussed some side effects of medications. BMP in 7 to 10 days. He is due for eye exam. Follow-up in 3 to 4 months.  Orders: -     Olmesartan Medoxomil; Take 1 tablet (20 mg total) by mouth daily.  Dispense: 90 tablet; Refill: 1 -     amLODIPine Besylate; Take 1 tablet (10 mg total) by mouth daily.  Dispense: 90 tablet; Refill: 2 -     Basic metabolic panel; Future  OSA (obstructive sleep apnea) Assessment & Plan: Dx'ed around 2018, he is not wearing a CPAP. We discussed possible complications if not appropriately treated. He may need a new sleep study. Referral to pulmonologist placed.  Orders: -     Ambulatory referral to Pulmonology  Hyperlipidemia associated with type 2 diabetes mellitus (HCC) Assessment & Plan: Continue Atorvastatin 10 mg daily. Low-fat diet also recommended. FLP will be added to next blood work, in 7-10 days.  Orders: -     Lipid panel; Future  Return in about 4 months (around 12/02/2022) for chronic problems.  Labs in 7-10 days..  Herminia Warren G. [place], MD  [facility]. [place] office.

## 2022-07-29 NOTE — Telephone Encounter (Signed)
Pt doubled up on his amLODipine (NORVASC) 5 MG tablet on his own, has run out and is requesting an emergency fill until his appt on 08/01/22

## 2022-07-29 NOTE — Telephone Encounter (Signed)
Rx sent in

## 2022-07-29 NOTE — Telephone Encounter (Signed)
It is ok to send Rx for Amlodipine to his pharmacy. Keep appt on 08/01/22. Thanks, BJ

## 2022-07-29 NOTE — Telephone Encounter (Signed)
Pt called back to say he is completely out of his Amlodipine, and has been for several days now.  Pt is very worried he may go the entire weekend without his Rx.  Pt was informed that MD is currently with patients, checks her messages in between, and it can take up to 72 hrs to fill Rx.  Please advise.

## 2022-08-01 ENCOUNTER — Encounter: Payer: Self-pay | Admitting: Family Medicine

## 2022-08-01 ENCOUNTER — Ambulatory Visit: Payer: BC Managed Care – PPO | Admitting: Family Medicine

## 2022-08-01 VITALS — BP 138/100 | HR 85 | Temp 98.5°F | Resp 12 | Ht 73.0 in | Wt 263.1 lb

## 2022-08-01 DIAGNOSIS — G4733 Obstructive sleep apnea (adult) (pediatric): Secondary | ICD-10-CM | POA: Diagnosis not present

## 2022-08-01 DIAGNOSIS — E785 Hyperlipidemia, unspecified: Secondary | ICD-10-CM

## 2022-08-01 DIAGNOSIS — I10 Essential (primary) hypertension: Secondary | ICD-10-CM

## 2022-08-01 DIAGNOSIS — E1169 Type 2 diabetes mellitus with other specified complication: Secondary | ICD-10-CM

## 2022-08-01 DIAGNOSIS — Z7984 Long term (current) use of oral hypoglycemic drugs: Secondary | ICD-10-CM

## 2022-08-01 LAB — POCT GLYCOSYLATED HEMOGLOBIN (HGB A1C): Hemoglobin A1C: 8.1 % — AB (ref 4.0–5.6)

## 2022-08-01 MED ORDER — AMLODIPINE BESYLATE 10 MG PO TABS: 10.0000 mg | ORAL_TABLET | Freq: Every day | ORAL | 2 refills | Status: DC

## 2022-08-01 MED ORDER — OLMESARTAN MEDOXOMIL 20 MG PO TABS: 20.0000 mg | ORAL_TABLET | Freq: Every day | ORAL | 1 refills | Status: DC

## 2022-08-01 MED ORDER — METFORMIN HCL 500 MG PO TABS: 1000.0000 mg | ORAL_TABLET | Freq: Two times a day (BID) | ORAL | 2 refills | Status: DC

## 2022-08-01 NOTE — Assessment & Plan Note (Signed)
Dx'ed around 2018, he is not wearing a CPAP. We discussed possible complications if not appropriately treated. He may need a new sleep study. Referral to pulmonologist placed.

## 2022-08-01 NOTE — Patient Instructions (Addendum)
A few things to remember from today's visit:  Primary hypertension - Plan: olmesartan (BENICAR) 20 MG tablet, amLODipine (NORVASC) 10 MG tablet  Dyslipidemia associated with type 2 diabetes mellitus (HCC)  Type 2 diabetes mellitus with other specified complication, without long-term current use of insulin (HCC) - Plan: POC HgB A1c, metFORMIN (GLUCOPHAGE) 500 MG tablet  OSA (obstructive sleep apnea) - Plan: Ambulatory referral to Pulmonology  Olmesartan 20 mg added today. Labs in 7-10 days. Continue Amlodipine 10 mg daily. Metformin increased to 1000 mg 2 times daily with meals.  If you need refills for medications you take chronically, please call your pharmacy. Do not use My Chart to request refills or for acute issues that need immediate attention. If you send a my chart message, it may take a few days to be addressed, specially if I am not in the office.  Please be sure medication list is accurate. If a new problem present, please set up appointment sooner than planned today.

## 2022-08-01 NOTE — Assessment & Plan Note (Addendum)
Comorbidities: Hypertension, obesity, and hyperlipidemia. Hemoglobin A1c is not at goal, it went from 10.2 to 8.1. We discussed options, he prefers not to add new medications. Increase metformin dose from 500 mg twice daily to 1000 mg twice daily with meals.  We reviewed some side effects. HgA1C was at goal in 06/2019. Regular exercise and healthy diet with avoidance of added sugar food intake is an important part of treatment and recommended. Annual eye exam, periodic dental and foot care recommended. F/U in 3-4 months.

## 2022-08-01 NOTE — Assessment & Plan Note (Addendum)
Continue Atorvastatin 10 mg daily. Low-fat diet also recommended. FLP will be added to next blood work, in 7-10 days.

## 2022-08-01 NOTE — Assessment & Plan Note (Signed)
BP is not adequately controlled. We reviewed possible complications of hypertension. Continue amlodipine 10 mg daily. Olmesartan 20 mg added today. Continue monitoring BP regularly. We discussed some side effects of medications. BMP in 7 to 10 days. He is due for eye exam. Follow-up in 3 to 4 months.

## 2022-08-09 ENCOUNTER — Other Ambulatory Visit: Payer: BC Managed Care – PPO

## 2022-08-16 ENCOUNTER — Other Ambulatory Visit (INDEPENDENT_AMBULATORY_CARE_PROVIDER_SITE_OTHER): Payer: BC Managed Care – PPO

## 2022-08-16 DIAGNOSIS — E1169 Type 2 diabetes mellitus with other specified complication: Secondary | ICD-10-CM

## 2022-08-16 DIAGNOSIS — E785 Hyperlipidemia, unspecified: Secondary | ICD-10-CM

## 2022-08-16 DIAGNOSIS — I10 Essential (primary) hypertension: Secondary | ICD-10-CM | POA: Diagnosis not present

## 2022-08-16 LAB — LIPID PANEL
Cholesterol: 115 mg/dL (ref 0–200)
HDL: 35.3 mg/dL — ABNORMAL LOW (ref >39.00)
LDL Cholesterol: 62 mg/dL (ref 0–99)
NonHDL: 79.63
Total CHOL/HDL Ratio: 3
Triglycerides: 87 mg/dL (ref 0.0–149.0)
VLDL: 17.4 mg/dL (ref 0.0–40.0)

## 2022-08-16 LAB — BASIC METABOLIC PANEL
BUN: 15 mg/dL (ref 6–23)
CO2: 26 mEq/L (ref 19–32)
Calcium: 9.7 mg/dL (ref 8.4–10.5)
Chloride: 106 mEq/L (ref 96–112)
Creatinine, Ser: 0.92 mg/dL (ref 0.40–1.50)
GFR: 102.19 mL/min (ref >60.00)
Glucose, Bld: 172 mg/dL — ABNORMAL HIGH (ref 70–99)
Potassium: 3.9 mEq/L (ref 3.5–5.1)
Sodium: 139 mEq/L (ref 135–145)

## 2022-09-15 ENCOUNTER — Encounter (INDEPENDENT_AMBULATORY_CARE_PROVIDER_SITE_OTHER): Payer: Self-pay

## 2022-10-14 ENCOUNTER — Encounter: Payer: Self-pay | Admitting: Pharmacist

## 2022-10-21 NOTE — Progress Notes (Incomplete)
HPI:  [name] is a 42 y.o.male with a PMHx significant for HTN, HLD, DM2, and allergic rhinitis here today for his routine physical examination.  Last seen: 08/01/2022 Last CPE: ***  Regular exercise 3 or more times per week: *** Following a healthy diet: ***  Chronic medical problems: ***   There is no immunization history on file for this patient.  Health Maintenance  Topic Date Due   OPHTHALMOLOGY EXAM  Never done   HIV Screening  Never done   DTaP/Tdap/Td (1 - Tdap) Never done   INFLUENZA VACCINE  Never done   COVID-19 Vaccine (1 - 2023-24 season) Never done   HEMOGLOBIN A1C  02/01/2023   Diabetic kidney evaluation - Urine ACR  04/22/2023   FOOT EXAM  04/22/2023   Diabetic kidney evaluation - eGFR measurement  08/16/2023   Hepatitis C Screening  Completed   HPV VACCINES  Aged Out    Last prostate ca screening: ***  -Negative for high alcohol intake or tobacco use.  -Concerns and/or follow up today: ***  Review of Systems  Current Outpatient Medications on File Prior to Visit  Medication Sig Dispense Refill   Accu-Chek Softclix Lancets lancets Use to test 1-2 times daily. 100 each 12   amLODipine (NORVASC) 10 MG tablet Take 1 tablet (10 mg total) by mouth daily. 90 tablet 2   atorvastatin (LIPITOR) 10 MG tablet Take 1 tablet (10 mg total) by mouth daily. 90 tablet 3   fluticasone (FLONASE) 50 MCG/ACT nasal spray Place 2 sprays into both nostrils daily. 16 g 6   glucose blood (ACCU-CHEK GUIDE) test strip Use to test 1-2 times daily. 100 each 12   metFORMIN (GLUCOPHAGE) 500 MG tablet Take 2 tablets (1,000 mg total) by mouth 2 (two) times daily with a meal. 360 tablet 2   montelukast (SINGULAIR) 10 MG tablet Take 1 tablet (10 mg total) by mouth at bedtime. 90 tablet 3   olmesartan (BENICAR) 20 MG tablet Take 1 tablet (20 mg total) by mouth daily. 90 tablet 1   valACYclovir (VALTREX) 500 MG tablet 1 tab bid x 3 days and repeat treatment for further  flare ups as soon as lesion appears. 6 tablet 2   No current facility-administered medications on file prior to visit.    Past Medical History:  Diagnosis Date   Allergy    DVT (deep venous thrombosis) (HCC) 2016   Hyperlipidemia     No past surgical history on file.  No Known Allergies  Family History  Problem Relation Age of Onset   Cancer Neg Hx     Social History   Socioeconomic History   Marital status: Single    Spouse name: Not on file   Number of children: Not on file   Years of education: Not on file   Highest education level: Not on file  Occupational History   Not on file  Tobacco Use   Smoking status: Former   Smokeless tobacco: Never  Substance and Sexual Activity   Alcohol use: Not Currently   Drug use: Never   Sexual activity: Yes    Partners: Female  Other Topics Concern   Not on file  Social History Narrative   Not on file   Social Determinants of Health   Financial Resource Strain: Not on file  Food Insecurity: Not on file  Transportation Needs: Not on file  Physical Activity: Not on file  Stress: Not on file  Social Connections: Unknown (11/24/2021)  Received from [facility]   Social Network    Social Network: Not on file    There were no vitals filed for this visit. There is no height or weight on file to calculate BMI.  Wt Readings from Last 3 Encounters:  08/01/22 263 lb 2 oz (119.4 kg)  06/08/22 261 lb 6.4 oz (118.6 kg)  04/22/22 250 lb 8 oz (113.6 kg)    Physical Exam  ASSESSMENT AND PLAN:  Derek Wang was seen today for his routine physical examination.   No orders of the defined types were placed in this encounter.   There are no diagnoses linked to this encounter.  No follow-ups on file.  I, Suanne Marker, acting as a scribe for Betty [place], MD., have documented all relevant documentation on the behalf of Betty [place], MD, as directed by  Betty [place], MD while in the presence of Betty  [place], MD.   I, Suanne Marker, have reviewed all documentation for this visit. The documentation on 10/21/22 for the exam, diagnosis, procedures, and orders are all accurate and complete.  Betty G. [place], MD  [facility]. [place] office.

## 2022-10-24 ENCOUNTER — Encounter: Payer: Self-pay | Admitting: Family Medicine

## 2022-12-20 NOTE — Progress Notes (Unsigned)
HPI: Mr. Randell Bessett is a 42 y.o.male with a PMHx significant for HTN, OSA, recurrent herpes labialis, HLD, and DM II here today for his routine physical examination.  Last CPE: not on file  Exercise: Patient states he walks 5000-7000 steps at work every day.  Diet: He sometimes cooks at home but often eats out. He doesn't eat vegetables daily. He heats mainly chicken, and snacks on potato chips. He is trying to avoid fried foods.  Sleep: He reports he is sleeping 7-8 hours per night.  Alcohol Use: He says he rarely drinks alcohol. When he does, he drinks wine.  Smoking: He smoked intermittently from around the age of [age] until he quit in 2013. He says he would smoke less than one pack per day.  Vision: He doesn't see an eye doctor regularly.  Dental: He doesn't have a dentist at this time.   There is no immunization history on file for this patient.  Health Maintenance  Topic Date Due   OPHTHALMOLOGY EXAM  Never done   HIV Screening  Never done   DTaP/Tdap/Td (1 - Tdap) Never done   INFLUENZA VACCINE  Never done   COVID-19 Vaccine (1 - 2023-24 season) Never done   HEMOGLOBIN A1C  02/01/2023   Diabetic kidney evaluation - Urine ACR  04/22/2023   FOOT EXAM  04/22/2023   Diabetic kidney evaluation - eGFR measurement  08/16/2023   Hepatitis C Screening  Completed   HPV VACCINES  Aged Out   Chronic medical problems:   Hypertension:  Medications: Currently on Olmesartan 20 mg daily. He has not been taking amlodipine 10 mg daily.  He doesn't check his BP regularly at home.  Side effects: none He is not trying to avoid salt in his diet.  Denies unusual or severe headache, visual changes, exertional chest pain, dyspnea, or palpitations.  Lab Results  Component Value Date   CREATININE 0.92 08/16/2022   BUN 15 08/16/2022   NA 139 08/16/2022   K 3.9 08/16/2022   CL 106 08/16/2022   CO2 26 08/16/2022   Hyperlipidemia: Currently on atorvastatin 10 mg daily.   Side effects from medication: none Lab Results  Component Value Date   CHOL 115 08/16/2022   HDL 35.30 (L) 08/16/2022   LDLCALC 62 08/16/2022   TRIG 87.0 08/16/2022   CHOLHDL 3 08/16/2022   Diabetes Mellitus II:  - Checking BG at home: He checks his blood sugar regularly. He says the readings in the mornings are in the 130s.  - Medications: Currently on metformin 1000 mg bid.  - eye exam: He is not seeing an eye care providerregularly.  - foot exam: 04/2022.   Lab Results  Component Value Date   HGBA1C 8.1 (A) 08/01/2022   Lab Results  Component Value Date   MICROALBUR 0.9 04/22/2022    OSA:  He is not currently using a CPAP.  + Loud snoring.  Patient reports no new concerns today.   Review of Systems  Constitutional:  Negative for activity change, appetite change and fever.  HENT:  Negative for nosebleeds, sore throat and trouble swallowing.   Eyes:  Negative for redness and visual disturbance.  Respiratory:  Negative for cough, shortness of breath and wheezing.   Cardiovascular:  Negative for chest pain, palpitations and leg swelling.  Gastrointestinal:  Negative for abdominal pain, blood in stool, nausea and vomiting.  Endocrine: Negative for cold intolerance, heat intolerance, polydipsia, polyphagia and polyuria.  Genitourinary:  Negative for decreased urine  volume, dysuria, genital sores, hematuria and testicular pain.  Musculoskeletal:  Negative for gait problem and myalgias.  Skin:  Negative for color change and rash.  Allergic/Immunologic: Positive for environmental allergies.  Neurological:  Negative for dizziness, syncope, weakness and headaches.  Hematological:  Negative for adenopathy. Does not bruise/bleed easily.  Psychiatric/Behavioral:  Negative for confusion. The patient is not nervous/anxious.    Current Outpatient Medications on File Prior to Visit  Medication Sig Dispense Refill   Accu-Chek Softclix Lancets lancets Use to test 1-2 times daily. 100  each 12   amLODipine (NORVASC) 10 MG tablet Take 1 tablet (10 mg total) by mouth daily. 90 tablet 2   atorvastatin (LIPITOR) 10 MG tablet Take 1 tablet (10 mg total) by mouth daily. 90 tablet 3   fluticasone (FLONASE) 50 MCG/ACT nasal spray Place 2 sprays into both nostrils daily. 16 g 6   glucose blood (ACCU-CHEK GUIDE) test strip Use to test 1-2 times daily. 100 each 12   metFORMIN (GLUCOPHAGE) 500 MG tablet Take 2 tablets (1,000 mg total) by mouth 2 (two) times daily with a meal. 360 tablet 2   montelukast (SINGULAIR) 10 MG tablet Take 1 tablet (10 mg total) by mouth at bedtime. 90 tablet 3   olmesartan (BENICAR) 20 MG tablet Take 1 tablet (20 mg total) by mouth daily. 90 tablet 1   valACYclovir (VALTREX) 500 MG tablet 1 tab bid x 3 days and repeat treatment for further flare ups as soon as lesion appears. 6 tablet 2   No current facility-administered medications on file prior to visit.   Past Medical History:  Diagnosis Date   Allergy    DVT (deep venous thrombosis) (HCC) 2016   Hyperlipidemia    No past surgical history on file.  No Known Allergies  Family History  Problem Relation Age of Onset   Cancer Neg Hx     Social History   Socioeconomic History   Marital status: Single    Spouse name: Not on file   Number of children: Not on file   Years of education: Not on file   Highest education level: Not on file  Occupational History   Not on file  Tobacco Use   Smoking status: Former   Smokeless tobacco: Never  Substance and Sexual Activity   Alcohol use: Not Currently   Drug use: Never   Sexual activity: Yes    Partners: Female  Other Topics Concern   Not on file  Social History Narrative   Not on file   Social Determinants of Health   Financial Resource Strain: Not on file  Food Insecurity: Not on file  Transportation Needs: Not on file  Physical Activity: Not on file  Stress: Not on file  Social Connections: Unknown (11/24/2021)   Received from [facility], [facility]   Social Network    Social Network: Not on file   Today's Vitals   12/21/22 0655 12/21/22 0723  BP: (!) 138/98 (!) 130/100  Pulse: 76   Resp: 12   Temp: 98.6 F (37 C)   TempSrc: Oral   SpO2: 96%   Weight: 263 lb 8 oz (119.5 kg)   Height: 6\' 1"  (1.854 m)    Body mass index is 34.76 kg/m. Wt Readings from Last 3 Encounters:  12/21/22 263 lb 8 oz (119.5 kg)  08/01/22 263 lb 2 oz (119.4 kg)  06/08/22 261 lb 6.4 oz (118.6 kg)   Physical Exam Vitals and nursing note reviewed.  Constitutional:  General: He is not in acute distress.    Appearance: He is well-developed.  HENT:     Head: Normocephalic and atraumatic.     Right Ear: Tympanic membrane, ear canal and external ear normal.     Left Ear: Tympanic membrane, ear canal and external ear normal.  Eyes:     Extraocular Movements: Extraocular movements intact.     Conjunctiva/sclera: Conjunctivae normal.     Pupils: Pupils are equal, round, and reactive to light.  Neck:     Thyroid: No thyromegaly.     Trachea: No tracheal deviation.  Cardiovascular:     Rate and Rhythm: Normal rate and regular rhythm.     Pulses:          Dorsalis pedis pulses are 2+ on the right side and 2+ on the left side.     Heart sounds: No murmur heard.    Comments: Trace bilateral pitting edema.  Pulmonary:     Effort: Pulmonary effort is normal. No respiratory distress.     Breath sounds: Normal breath sounds.  Abdominal:     Palpations: Abdomen is soft. There is no hepatomegaly or mass.     Tenderness: There is no abdominal tenderness.  Genitourinary:    Comments: No concerns. Musculoskeletal:        General: No tenderness.     Cervical back: Normal range of motion.     Right lower leg: Pitting Edema present.     Left lower leg: Pitting Edema present.     Comments: No major deformities appreciated and no signs of synovitis.  Lymphadenopathy:     Cervical: No cervical adenopathy.     Upper Body:     Right  upper body: No supraclavicular adenopathy.     Left upper body: No supraclavicular adenopathy.  Skin:    General: Skin is warm.     Findings: No erythema.  Neurological:     General: No focal deficit present.     Mental Status: He is alert and oriented to person, place, and time.     Cranial Nerves: No cranial nerve deficit.     Sensory: No sensory deficit.     Gait: Gait normal.     Deep Tendon Reflexes:     Reflex Scores:      Bicep reflexes are 2+ on the right side and 2+ on the left side.      Patellar reflexes are 2+ on the right side and 2+ on the left side. Psychiatric:        Mood and Affect: Mood and affect normal.   ASSESSMENT AND PLAN:  Mr. Silvas was seen today for his routine annual physical examination.   Orders Placed This Encounter  Procedures   Basic metabolic panel   Hemoglobin A1c   Lab Results  Component Value Date   NA 138 12/21/2022   CL 102 12/21/2022   K 4.0 12/21/2022   CO2 28 12/21/2022   BUN 12 12/21/2022   CREATININE 1.05 12/21/2022   GFR 86.99 12/21/2022   CALCIUM 9.8 12/21/2022   ALBUMIN 4.7 04/22/2022   GLUCOSE 147 (H) 12/21/2022   Lab Results  Component Value Date   HGBA1C 9.2 (H) 12/21/2022   Routine general medical examination at a health care facility Assessment & Plan: We discussed the importance of regular physical activity and healthy diet for prevention of chronic illness and/or complications. Preventive guidelines reviewed. Vaccination: Reports Tdap as up to date and declined flu vaccine. Next CPE in a  year.   Primary hypertension Assessment & Plan: BP is not well controlled. Possible complications of elevated BP discussed. He has not been taking amlodipine 10 mg , so resume medication today, continue Olmesartan same dose.  Changed to combination amlodipine-olmesartan 10-20 mg daily. Instructed to monitor BP regularly and to let me know about BP readings in 3 weeks. Low-salt diet recommended. He is due for eye  exam. Follow-up in 4 months, before if needed.  Orders: -     Basic metabolic panel; Future -     amLODIPine-Olmesartan; Take 1 tablet by mouth daily.  Dispense: 90 tablet; Refill: 2  Type 2 diabetes mellitus with other specified complication, without long-term current use of insulin (HCC) Assessment & Plan: Last hemoglobin A1c was not at goal, 8.1 in 08/2022. Continue metformin at 1000 mg twice daily. Further recommendation will be given according to hemoglobin A1c result. Appropriate footcare,periodic visits to dentist,and eye exam recommended. F/U in 4 months.  Orders: -     Basic metabolic panel; Future -     Hemoglobin A1c; Future  OSA (obstructive sleep apnea) Assessment & Plan: He is not wearing CPAP. We discussed possible complications if not treated.   Return in 4 months (on 04/20/2023).  I, Suanne Marker, acting as a scribe for Dhruvi Crenshaw [place], MD., have documented all relevant documentation on the behalf of Tajai Ihde [place], MD, as directed by  Torian Quintero [place], MD while in the presence of Gracious Renken [place], MD.   I, Suanne Marker, have reviewed all documentation for this visit. The documentation on 12/20/22 for the exam, diagnosis, procedures, and orders are all accurate and complete.  Wafa Martes G. [place], MD  [facility] [place] office.

## 2022-12-21 ENCOUNTER — Encounter: Payer: Self-pay | Admitting: Family Medicine

## 2022-12-21 ENCOUNTER — Other Ambulatory Visit: Payer: Commercial Managed Care - PPO

## 2022-12-21 ENCOUNTER — Ambulatory Visit (INDEPENDENT_AMBULATORY_CARE_PROVIDER_SITE_OTHER): Payer: Commercial Managed Care - PPO | Admitting: Family Medicine

## 2022-12-21 VITALS — BP 130/100 | HR 76 | Temp 98.6°F | Resp 12 | Ht 73.0 in | Wt 263.5 lb

## 2022-12-21 DIAGNOSIS — Z0000 Encounter for general adult medical examination without abnormal findings: Secondary | ICD-10-CM | POA: Diagnosis not present

## 2022-12-21 DIAGNOSIS — E1169 Type 2 diabetes mellitus with other specified complication: Secondary | ICD-10-CM

## 2022-12-21 DIAGNOSIS — G4733 Obstructive sleep apnea (adult) (pediatric): Secondary | ICD-10-CM | POA: Diagnosis not present

## 2022-12-21 DIAGNOSIS — Z7984 Long term (current) use of oral hypoglycemic drugs: Secondary | ICD-10-CM

## 2022-12-21 DIAGNOSIS — I10 Essential (primary) hypertension: Secondary | ICD-10-CM | POA: Diagnosis not present

## 2022-12-21 LAB — BASIC METABOLIC PANEL
BUN: 12 mg/dL (ref 6–23)
CO2: 28 meq/L (ref 19–32)
Calcium: 9.8 mg/dL (ref 8.4–10.5)
Chloride: 102 meq/L (ref 96–112)
Creatinine, Ser: 1.05 mg/dL (ref 0.40–1.50)
GFR: 86.99 mL/min (ref >60.00)
Glucose, Bld: 147 mg/dL — ABNORMAL HIGH (ref 70–99)
Potassium: 4 meq/L (ref 3.5–5.1)
Sodium: 138 meq/L (ref 135–145)

## 2022-12-21 MED ORDER — AMLODIPINE-OLMESARTAN 10-20 MG PO TABS
1.0000 | ORAL_TABLET | Freq: Every day | ORAL | 2 refills | Status: DC
Start: 2022-12-21 — End: 2023-04-21

## 2022-12-21 NOTE — Assessment & Plan Note (Signed)
We discussed the importance of regular physical activity and healthy diet for prevention of chronic illness and/or complications. Preventive guidelines reviewed. Vaccination: Reports Tdap as up to date and declined flu vaccine. Next CPE in a year.

## 2022-12-21 NOTE — Assessment & Plan Note (Signed)
BP is not well controlled. Possible complications of elevated BP discussed. He has not been taking amlodipine 10 mg , so resume medication today, continue Olmesartan same dose.  Changed to combination amlodipine-olmesartan 10-20 mg daily. Instructed to monitor BP regularly and to let me know about BP readings in 3 weeks. Low-salt diet recommended. He is due for eye exam. Follow-up in 4 months, before if needed.

## 2022-12-21 NOTE — Assessment & Plan Note (Signed)
He is not wearing CPAP. We discussed possible complications if not treated.

## 2022-12-21 NOTE — Patient Instructions (Addendum)
A few things to remember from today's visit:  Routine general medical examination at a health care facility  Primary hypertension - Plan: Basic metabolic panel, amlodipine-olmesartan (AZOR) 10-20 MG tablet  Hyperlipidemia associated with type 2 diabetes mellitus (HCC)  Type 2 diabetes mellitus with other specified complication, without long-term current use of insulin (HCC) - Plan: Basic metabolic panel, Hemoglobin A1c  Blood pressure medication changed to 1 tab with 2 medications. Monitor blood pressure and let me know about readings in 3 weeks. You need an eye exam.  If you need refills for medications you take chronically, please call your pharmacy. Do not use My Chart to request refills or for acute issues that need immediate attention. If you send a my chart message, it may take a few days to be addressed, specially if I am not in the office.  Please be sure medication list is accurate. If a new problem present, please set up appointment sooner than planned today.  Health Maintenance, Male Adopting a healthy lifestyle and getting preventive care are important in promoting health and wellness. Ask your health care provider about: The right schedule for you to have regular tests and exams. Things you can do on your own to prevent diseases and keep yourself healthy. What should I know about diet, weight, and exercise? Eat a healthy diet  Eat a diet that includes plenty of vegetables, fruits, low-fat dairy products, and lean protein. Do not eat a lot of foods that are high in solid fats, added sugars, or sodium. Maintain a healthy weight Body mass index (BMI) is a measurement that can be used to identify possible weight problems. It estimates body fat based on height and weight. Your health care provider can help determine your BMI and help you achieve or maintain a healthy weight. Get regular exercise Get regular exercise. This is one of the most important things you can do for  your health. Most adults should: Exercise for at least 150 minutes each week. The exercise should increase your heart rate and make you sweat (moderate-intensity exercise). Do strengthening exercises at least twice a week. This is in addition to the moderate-intensity exercise. Spend less time sitting. Even light physical activity can be beneficial. Watch cholesterol and blood lipids Have your blood tested for lipids and cholesterol at 43 years of age, then have this test every 5 years. You may need to have your cholesterol levels checked more often if: Your lipid or cholesterol levels are high. You are older than 43 years of age. You are at high risk for heart disease. What should I know about cancer screening? Many types of cancers can be detected early and may often be prevented. Depending on your health history and family history, you may need to have cancer screening at various ages. This may include screening for: Colorectal cancer. Prostate cancer. Skin cancer. Lung cancer. What should I know about heart disease, diabetes, and high blood pressure? Blood pressure and heart disease High blood pressure causes heart disease and increases the risk of stroke. This is more likely to develop in people who have high blood pressure readings or are overweight. Talk with your health care provider about your target blood pressure readings. Have your blood pressure checked: Every 3-5 years if you are 26-53 years of age. Every year if you are 76 years old or older. If you are between the ages of 39 and 34 and are a current or former smoker, ask your health care provider if you should  have a one-time screening for abdominal aortic aneurysm (AAA). Diabetes Have regular diabetes screenings. This checks your fasting blood sugar level. Have the screening done: Once every three years after age 76 if you are at a normal weight and have a low risk for diabetes. More often and at a younger age if you are  overweight or have a high risk for diabetes. What should I know about preventing infection? Hepatitis B If you have a higher risk for hepatitis B, you should be screened for this virus. Talk with your health care provider to find out if you are at risk for hepatitis B infection. Hepatitis C Blood testing is recommended for: Everyone born from [birth year] through 1966. Anyone with known risk factors for hepatitis C. Sexually transmitted infections (STIs) You should be screened each year for STIs, including gonorrhea and chlamydia, if: You are sexually active and are younger than 43 years of age. You are older than 43 years of age and your health care provider tells you that you are at risk for this type of infection. Your sexual activity has changed since you were last screened, and you are at increased risk for chlamydia or gonorrhea. Ask your health care provider if you are at risk. Ask your health care provider about whether you are at high risk for HIV. Your health care provider may recommend a prescription medicine to help prevent HIV infection. If you choose to take medicine to prevent HIV, you should first get tested for HIV. You should then be tested every 3 months for as long as you are taking the medicine. Follow these instructions at home: Alcohol use Do not drink alcohol if your health care provider tells you not to drink. If you drink alcohol: Limit how much you have to 0-2 drinks a day. Know how much alcohol is in your drink. In the [place] one drink equals one 12 oz bottle of beer (355 mL), one 5 oz glass of wine (148 mL), or one 1 oz glass of hard liquor (44 mL). Lifestyle Do not use any products that contain nicotine or tobacco. These products include cigarettes, chewing tobacco, and vaping devices, such as e-cigarettes. If you need help quitting, ask your health care provider. Do not use street drugs. Do not share needles. Ask your health care provider for help if you need support or  information about quitting drugs. General instructions Schedule regular health, dental, and eye exams. Stay current with your vaccines. Tell your health care provider if: You often feel depressed. You have ever been abused or do not feel safe at home. Summary Adopting a healthy lifestyle and getting preventive care are important in promoting health and wellness. Follow your health care provider's instructions about healthy diet, exercising, and getting tested or screened for diseases. Follow your health care provider's instructions on monitoring your cholesterol and blood pressure. This information is not intended to replace advice given to you by your health care provider. Make sure you discuss any questions you have with your health care provider. Document Revised: 06/08/2020 Document Reviewed: 06/08/2020 Elsevier Patient Education  2024 [employer].

## 2022-12-21 NOTE — Assessment & Plan Note (Addendum)
Last hemoglobin A1c was not at goal, 8.1 in 08/2022. Continue metformin at 1000 mg twice daily. Further recommendation will be given according to hemoglobin A1c result. Appropriate footcare,periodic visits to dentist,and eye exam recommended. F/U in 4 months.

## 2022-12-22 LAB — HEMOGLOBIN A1C
Hgb A1c MFr Bld: 9.2 %{Hb} — ABNORMAL HIGH (ref <5.7)
Mean Plasma Glucose: 217 mg/dL
eAG (mmol/L): 12 mmol/L

## 2022-12-22 MED ORDER — METFORMIN HCL 500 MG PO TABS: 1000.0000 mg | ORAL_TABLET | Freq: Two times a day (BID) | ORAL | 2 refills | Status: DC

## 2022-12-23 ENCOUNTER — Telehealth: Payer: Self-pay | Admitting: Family Medicine

## 2022-12-23 MED ORDER — TRULICITY 0.75 MG/0.5ML ~~LOC~~ SOAJ: 0.7500 mg | SUBCUTANEOUS | 0 refills | Status: DC

## 2022-12-23 NOTE — Telephone Encounter (Signed)
Rx sent 

## 2022-12-23 NOTE — Telephone Encounter (Signed)
Patient called and stated he would like to add Trulicity to his prescriptions.  [id number] - [place], [place] - [address] AT [facility] [address] Phone: [phone]  Fax: [phone]     Please advise pt at [phone] if needed.

## 2022-12-26 ENCOUNTER — Other Ambulatory Visit (HOSPITAL_COMMUNITY): Payer: Self-pay

## 2022-12-26 ENCOUNTER — Telehealth: Payer: Self-pay

## 2022-12-26 NOTE — Telephone Encounter (Signed)
*  Primary  Pharmacy Patient Advocate Encounter  Received notification from [facility] that Prior Authorization for Trulicity 0.75MG /0.5ML auto-injectors  has been APPROVED from 12/26/2022 to 12/25/2023. Ran test claim, Copay is $25.00. This test claim was processed through [facility]- copay amounts may vary at other pharmacies due to pharmacy/plan contracts, or as the patient moves through the different stages of their insurance plan.   PA #/Case ID/Reference #: [id number]

## 2023-03-14 ENCOUNTER — Other Ambulatory Visit: Payer: Self-pay | Admitting: Family Medicine

## 2023-03-14 MED ORDER — TRULICITY 1.5 MG/0.5ML ~~LOC~~ SOAJ: 1.5000 mg | SUBCUTANEOUS | 2 refills | Status: DC

## 2023-04-19 NOTE — Progress Notes (Incomplete)
 HPI: Derek Wang is a 43 y.o. male with a PMHx significant for HTN, OSA, recurrent herpes labialis, HLD, and DM II, who is here today for chronic disease management.  Last seen on 12/21/2022  *** Review of Systems See other pertinent positives and negatives in HPI.  Current Outpatient Medications on File Prior to Visit  Medication Sig Dispense Refill   Accu-Chek Softclix Lancets lancets Use to test 1-2 times daily. 100 each 12   amlodipine-olmesartan (AZOR) 10-20 MG tablet Take 1 tablet by mouth daily. 90 tablet 2   atorvastatin (LIPITOR) 10 MG tablet Take 1 tablet (10 mg total) by mouth daily. 90 tablet 3   Dulaglutide (TRULICITY) 1.5 MG/0.5ML SOAJ Inject 1.5 mg into the skin once a week. 2 mL 2   fluticasone (FLONASE) 50 MCG/ACT nasal spray Place 2 sprays into both nostrils daily. 16 g 6   glucose blood (ACCU-CHEK GUIDE) test strip Use to test 1-2 times daily. 100 each 12   metFORMIN (GLUCOPHAGE) 500 MG tablet Take 2 tablets (1,000 mg total) by mouth 2 (two) times daily with a meal. 360 tablet 2   montelukast (SINGULAIR) 10 MG tablet Take 1 tablet (10 mg total) by mouth at bedtime. 90 tablet 3   valACYclovir (VALTREX) 500 MG tablet 1 tab bid x 3 days and repeat treatment for further flare ups as soon as lesion appears. 6 tablet 2   No current facility-administered medications on file prior to visit.   Past Medical History:  Diagnosis Date   Allergy    DVT (deep venous thrombosis) (HCC) 2016   Hyperlipidemia    No Known Allergies  Social History   Socioeconomic History   Marital status: Single    Spouse name: Not on file   Number of children: Not on file   Years of education: Not on file   Highest education level: Not on file  Occupational History   Not on file  Tobacco Use   Smoking status: Former   Smokeless tobacco: Never  Substance and Sexual Activity   Alcohol use: Not Currently   Drug use: Never   Sexual activity: Yes    Partners: Female   Other Topics Concern   Not on file  Social History Narrative   Not on file   Social Drivers of Health   Financial Resource Strain: Not on file  Food Insecurity: Not on file  Transportation Needs: Not on file  Physical Activity: Not on file  Stress: Not on file  Social Connections: Unknown (11/24/2021)   Received from [facility]   Social Network    Social Network: Not on file   There were no vitals filed for this visit. There is no height or weight on file to calculate BMI.  Physical Exam  ASSESSMENT AND PLAN:  Derek Wang was seen today for chronic disease management.   No orders of the defined types were placed in this encounter.   No problem-specific Assessment & Plan notes found for this encounter.  No follow-ups on file.  I, Derek Wang, acting as a scribe for Derek [place], MD., have documented all relevant documentation on the behalf of Derek [place], MD, as directed by  Derek [place], MD while in the presence of Derek [place], MD.   I, Derek [place], MD, have reviewed all documentation for this visit. The documentation on 04/19/23 for the exam, diagnosis, procedures, and orders are all accurate and complete.  Derek G. [place], MD  [facility] [place] office.

## 2023-04-21 ENCOUNTER — Ambulatory Visit: Payer: Commercial Managed Care - PPO | Admitting: Family Medicine

## 2023-04-21 ENCOUNTER — Encounter: Payer: Self-pay | Admitting: Family Medicine

## 2023-04-21 VITALS — BP 128/98 | HR 100 | Resp 12 | Ht 73.0 in | Wt 265.1 lb

## 2023-04-21 DIAGNOSIS — E1169 Type 2 diabetes mellitus with other specified complication: Secondary | ICD-10-CM | POA: Diagnosis not present

## 2023-04-21 DIAGNOSIS — Z7984 Long term (current) use of oral hypoglycemic drugs: Secondary | ICD-10-CM

## 2023-04-21 DIAGNOSIS — Z23 Encounter for immunization: Secondary | ICD-10-CM | POA: Diagnosis not present

## 2023-04-21 DIAGNOSIS — I10 Essential (primary) hypertension: Secondary | ICD-10-CM | POA: Diagnosis not present

## 2023-04-21 DIAGNOSIS — Z7985 Long-term (current) use of injectable non-insulin antidiabetic drugs: Secondary | ICD-10-CM

## 2023-04-21 LAB — POCT GLYCOSYLATED HEMOGLOBIN (HGB A1C): Hemoglobin A1C: 7.6 % — AB (ref 4.0–5.6)

## 2023-04-21 LAB — COMPREHENSIVE METABOLIC PANEL
ALT: 22 U/L (ref 0–53)
AST: 20 U/L (ref 0–37)
Albumin: 4.6 g/dL (ref 3.5–5.2)
Alkaline Phosphatase: 68 U/L (ref 39–117)
BUN: 17 mg/dL (ref 6–23)
CO2: 25 meq/L (ref 19–32)
Calcium: 9.6 mg/dL (ref 8.4–10.5)
Chloride: 103 meq/L (ref 96–112)
Creatinine, Ser: 0.99 mg/dL (ref 0.40–1.50)
GFR: 93.14 mL/min (ref >60.00)
Glucose, Bld: 155 mg/dL — ABNORMAL HIGH (ref 70–99)
Potassium: 3.8 meq/L (ref 3.5–5.1)
Sodium: 139 meq/L (ref 135–145)
Total Bilirubin: 0.6 mg/dL (ref 0.2–1.2)
Total Protein: 7.3 g/dL (ref 6.0–8.3)

## 2023-04-21 LAB — MICROALBUMIN / CREATININE URINE RATIO
Creatinine,U: 145 mg/dL
Microalb Creat Ratio: 4.8 mg/g (ref 0.0–30.0)
Microalb, Ur: 0.7 mg/dL (ref 0.0–1.9)

## 2023-04-21 MED ORDER — ATORVASTATIN CALCIUM 10 MG PO TABS: 10.0000 mg | ORAL_TABLET | Freq: Every day | ORAL | 3 refills | Status: AC

## 2023-04-21 MED ORDER — AMLODIPINE-OLMESARTAN 10-40 MG PO TABS: 1.0000 | ORAL_TABLET | Freq: Every day | ORAL | 1 refills | Status: DC

## 2023-04-21 NOTE — Patient Instructions (Addendum)
 A few things to remember from today's visit:  Type 2 diabetes mellitus with other specified complication, without long-term current use of insulin (HCC) - Plan: Microalbumin / creatinine urine ratio, Comprehensive metabolic panel, POC HgB A1c  Primary hypertension - Plan: Comprehensive metabolic panel, amLODipine-olmesartan (AZOR) 10-40 MG tablet Increase Metformin to 2 tab with breakfast and dinner. Today blood pressure medication was adjusted, Olmesartan increased from 20 mg to 40 mg. New prescription sent. Continue monitoring blood pressure, goal 120/70's  If you need refills for medications you take chronically, please call your pharmacy. Do not use My Chart to request refills or for acute issues that need immediate attention. If you send a my chart message, it may take a few days to be addressed, specially if I am not in the office.  Please be sure medication list is accurate. If a new problem present, please set up appointment sooner than planned today.

## 2023-04-21 NOTE — Assessment & Plan Note (Signed)
 BP mildly elevated, reporting similar readings at home. Recommend increasing olmesartan from 20 mg to 40 mg, new prescription for Amlodipine-olmesartan 10-40 mg sent to his pharmacy to take once daily. Continue low-salt diet. Continue monitoring BP regularly. He is overdue for eye exam. Follow-up in 4 to 5 months, before if needed.

## 2023-04-21 NOTE — Assessment & Plan Note (Signed)
 Hemoglobin A1c improved but still not at goal, today 7.6. He is taking metformin 500 mg twice daily, increase dose to 1000 mg twice daily. Continue Trulicity 1.5 mg weekly. Appropriate footcare,periodic visits to dentist,and eye exam recommended. F/U in 4-5 months.

## 2023-04-21 NOTE — Progress Notes (Signed)
 HPI: Derek Wang is a 43 y.o. male with a PMHx significant for HTN, OSA, recurrent herpes labialis, HLD, and DM II, who is here today for chronic disease management.  Last seen on 12/21/2022  Diabetes Mellitus II:  - Checking BG at home: He has been checking his blood sugar regularly at home. In the morning it is around 110 and it is lower after taking his metformin.  - Medications: Currently on Trulicity 1.5 mg weekly and Metformin 500 mg twice daily.  - Diet: He has been eating fewer sweets but believes he is eating more carbs.  - Exercise: He gets at least 10000 steps per day at work.  - eye exam: He hasn't had an eye exam for some time.  - foot exam: Performed today.  - Negative for symptoms of hypoglycemia, polyuria, polydipsia, numbness, tingling, or burning of the extremities, or foot ulcers/trauma  Lab Results  Component Value Date   HGBA1C 9.2 (H) 12/21/2022   Lab Results  Component Value Date   MICROALBUR 0.9 04/22/2022   Hypertension:  Medications: Currently on amlodipine-olmesartan 10-20 mg daily.  BP readings at home: He checks his BP at home occasionally. He says his readings are usually 120s/85-95.  Side effects: none Negative for unusual or severe headache, visual changes, exertional chest pain, dyspnea,  focal weakness, or edema.  Lab Results  Component Value Date   CREATININE 1.05 12/21/2022   BUN 12 12/21/2022   NA 138 12/21/2022   K 4.0 12/21/2022   CL 102 12/21/2022   CO2 28 12/21/2022   Review of Systems  Constitutional:  Negative for activity change, appetite change, chills and fever.  HENT:  Negative for nosebleeds and sore throat.   Respiratory:  Negative for cough and wheezing.   Gastrointestinal:  Negative for abdominal pain, nausea and vomiting.  Genitourinary:  Negative for decreased urine volume, dysuria and hematuria.  Skin:  Negative for rash.  Neurological:  Negative for syncope and facial asymmetry.  See other pertinent  positives and negatives in HPI.  Current Outpatient Medications on File Prior to Visit  Medication Sig Dispense Refill   Accu-Chek Softclix Lancets lancets Use to test 1-2 times daily. 100 each 12   atorvastatin (LIPITOR) 10 MG tablet Take 1 tablet (10 mg total) by mouth daily. 90 tablet 3   Dulaglutide (TRULICITY) 1.5 MG/0.5ML SOAJ Inject 1.5 mg into the skin once a week. 2 mL 2   fluticasone (FLONASE) 50 MCG/ACT nasal spray Place 2 sprays into both nostrils daily. 16 g 6   glucose blood (ACCU-CHEK GUIDE) test strip Use to test 1-2 times daily. 100 each 12   metFORMIN (GLUCOPHAGE) 500 MG tablet Take 2 tablets (1,000 mg total) by mouth 2 (two) times daily with a meal. 360 tablet 2   montelukast (SINGULAIR) 10 MG tablet Take 1 tablet (10 mg total) by mouth at bedtime. 90 tablet 3   valACYclovir (VALTREX) 500 MG tablet 1 tab bid x 3 days and repeat treatment for further flare ups as soon as lesion appears. 6 tablet 2   No current facility-administered medications on file prior to visit.   Past Medical History:  Diagnosis Date   Allergy    DVT (deep venous thrombosis) (HCC) 2016   Hyperlipidemia    No Known Allergies  Social History   Socioeconomic History   Marital status: Single    Spouse name: Not on file   Number of children: Not on file   Years of education: Not on  file   Highest education level: Not on file  Occupational History   Not on file  Tobacco Use   Smoking status: Former   Smokeless tobacco: Never  Substance and Sexual Activity   Alcohol use: Not Currently   Drug use: Never   Sexual activity: Yes    Partners: Female  Other Topics Concern   Not on file  Social History Narrative   Not on file   Social Drivers of Health   Financial Resource Strain: Not on file  Food Insecurity: Not on file  Transportation Needs: Not on file  Physical Activity: Not on file  Stress: Not on file  Social Connections: Unknown (11/24/2021)   Received from [facility]   Social Network    Social Network: Not on file    Vitals:   04/21/23 0716 04/21/23 0739  BP: (!) 128/98 (!) 128/98  Pulse: 100   Resp: 12   SpO2: 95%    Body mass index is 34.98 kg/m.  Physical Exam Vitals and nursing note reviewed.  Constitutional:      General: He is not in acute distress.    Appearance: He is well-developed.  HENT:     Head: Normocephalic and atraumatic.     Mouth/Throat:     Mouth: Mucous membranes are moist.     Pharynx: Oropharynx is clear. Uvula midline.  Eyes:     Conjunctiva/sclera: Conjunctivae normal.  Cardiovascular:     Rate and Rhythm: Normal rate and regular rhythm.     Pulses:          Dorsalis pedis pulses are 2+ on the right side and 2+ on the left side.     Heart sounds: No murmur heard. Pulmonary:     Effort: Pulmonary effort is normal. No respiratory distress.     Breath sounds: Normal breath sounds.  Abdominal:     Palpations: Abdomen is soft. There is no hepatomegaly or mass.     Tenderness: There is no abdominal tenderness.  Musculoskeletal:     Right lower leg: No edema.     Left lower leg: No edema.  Skin:    General: Skin is warm.     Findings: No erythema or rash.  Neurological:     Mental Status: He is alert and oriented to person, place, and time.     Cranial Nerves: No cranial nerve deficit.     Gait: Gait normal.  Psychiatric:        Mood and Affect: Mood and affect normal.   ASSESSMENT AND PLAN:  [name] was seen today for chronic disease management.   Orders Placed This Encounter  Procedures   Tdap vaccine greater than or equal to 7yo IM   Microalbumin / creatinine urine ratio   Comprehensive metabolic panel   POC HgB A1c   Lab Results  Component Value Date   HGBA1C 7.6 (A) 04/21/2023   Lab Results  Component Value Date   MICROALBUR 0.7 04/21/2023   MICROALBUR 0.9 04/22/2022   Lab Results  Component Value Date   NA 139 04/21/2023   CL 103 04/21/2023   K 3.8 04/21/2023   CO2 25  04/21/2023   BUN 17 04/21/2023   CREATININE 0.99 04/21/2023   GFR 93.14 04/21/2023   CALCIUM 9.6 04/21/2023   ALBUMIN 4.6 04/21/2023   GLUCOSE 155 (H) 04/21/2023   Lab Results  Component Value Date   ALT 22 04/21/2023   AST 20 04/21/2023   ALKPHOS 68 04/21/2023  BILITOT 0.6 04/21/2023   Type 2 diabetes mellitus with other specified complication, without long-term current use of insulin (HCC) Assessment & Plan: Hemoglobin A1c improved but still not at goal, today 7.6. He is taking metformin 500 mg twice daily, increase dose to 1000 mg twice daily. Continue Trulicity 1.5 mg weekly. Appropriate footcare,periodic visits to dentist,and eye exam recommended. F/U in 4-5 months.  Orders: -     Microalbumin / creatinine urine ratio; Future -     Comprehensive metabolic panel; Future -     POCT glycosylated hemoglobin (Hb A1C)  Primary hypertension Assessment & Plan: BP mildly elevated, reporting similar readings at home. Recommend increasing olmesartan from 20 mg to 40 mg, new prescription for Amlodipine-olmesartan 10-40 mg sent to his pharmacy to take once daily. Continue low-salt diet. Continue monitoring BP regularly. He is overdue for eye exam. Follow-up in 4 to 5 months, before if needed.  Orders: -     Comprehensive metabolic panel; Future -     amLODIPine-Olmesartan; Take 1 tablet by mouth daily.  Dispense: 90 tablet; Refill: 1  Need for Tdap vaccination -     Tdap vaccine greater than or equal to 7yo IM  Return in about 5 months (around 09/21/2023) for chronic problems.  I, Rolla Etienne Wierda, acting as a scribe for Iliany Losier [place], MD., have documented all relevant documentation on the behalf of Masako Overall [place], MD, as directed by  Monterius Rolf [place], MD while in the presence of Jodel Mayhall [place], MD.   I, Jamee Keach [place], MD, have reviewed all documentation for this visit. The documentation on 04/21/23 for the exam, diagnosis, procedures, and orders are all accurate and complete.  Vanita Cannell  G. [place], MD  [facility]. [place] office.

## 2023-06-12 ENCOUNTER — Encounter: Payer: Self-pay | Admitting: Family Medicine

## 2023-06-13 ENCOUNTER — Encounter: Payer: Self-pay | Admitting: Family Medicine

## 2023-06-13 ENCOUNTER — Ambulatory Visit: Admitting: Family Medicine

## 2023-06-13 VITALS — BP 126/80 | HR 94 | Resp 12 | Ht 73.0 in | Wt 264.0 lb

## 2023-06-13 DIAGNOSIS — E66811 Obesity, class 1: Secondary | ICD-10-CM

## 2023-06-13 DIAGNOSIS — Z6834 Body mass index (BMI) 34.0-34.9, adult: Secondary | ICD-10-CM

## 2023-06-13 DIAGNOSIS — E6609 Other obesity due to excess calories: Secondary | ICD-10-CM

## 2023-06-13 DIAGNOSIS — N529 Male erectile dysfunction, unspecified: Secondary | ICD-10-CM | POA: Diagnosis not present

## 2023-06-13 MED ORDER — SILDENAFIL CITRATE 20 MG PO TABS: 40.0000 mg | ORAL_TABLET | Freq: Every day | ORAL | 1 refills | Status: AC | PRN

## 2023-06-13 NOTE — Assessment & Plan Note (Signed)
 We discussed possible etiologies, treatment options,and some side effects of medication. Aggressive management of some of his chronic medical conditions may help. We can add TSH to next blood work. He would like to try Sildenafil, starting with 20 mg daily prn and titrate up to 100 mg if needed. He will let me know about effectiveness, may consider Cialis or vardenafil (levitra) if sildenafil doe snot help. Keep appt in 10/2023.

## 2023-06-13 NOTE — Patient Instructions (Addendum)
 A few things to remember from today's visit:  Erectile dysfunction, unspecified erectile dysfunction type - Plan: sildenafil (REVATIO) 20 MG tablet Start with Sildenafil 20 mg then increase dose if needed to 30 mg,40 mg and so forth, max 100 mg daily.  If you need refills for medications you take chronically, please call your pharmacy. Do not use My Chart to request refills or for acute issues that need immediate attention. If you send a my chart message, it may take a few days to be addressed, specially if I am not in the office.  Please be sure medication list is accurate. If a new problem present, please set up appointment sooner than planned today.

## 2023-06-13 NOTE — Assessment & Plan Note (Signed)
 We discussed the benefits of wt loss as well as adverse effects of obesity. Wt loss will definitively help with his other comorbilities. Consistency with healthy diet and physical activity encouraged.

## 2023-06-13 NOTE — Progress Notes (Signed)
 ACUTE VISIT Chief Complaint  Patient presents with   Medication Refill    Discuss medication for ED    HPI: [name] is a 43 y.o. male with a PMHx significant for HTN, OSA, recurrent herpes labialis, HLD, and DM II, who is here today complaining of ED.   Problems with erectile dysfunction for about two years. He has problems maintaining erections most of the time. Denies problems getting erections.  He is having morning erections.  Has not tried any medications before.  Pertinent negatives include increased fatigue, penile drainage, anatomical deformities, testicular masses, or decreased libido.  He has not tried medications in the past. Problem gradually getting worse.  DM II not well controlled. Lab Results  Component Value Date   HGBA1C 7.6 (A) 04/21/2023   On Amlodipine -Olmesartan  10-40 mg for HTN. Lab Results  Component Value Date   NA 139 04/21/2023   CL 103 04/21/2023   K 3.8 04/21/2023   CO2 25 04/21/2023   BUN 17 04/21/2023   CREATININE 0.99 04/21/2023   GFR 93.14 04/21/2023   CALCIUM  9.6 04/21/2023   ALBUMIN 4.6 04/21/2023   GLUCOSE 155 (H) 04/21/2023   Review of Systems  Constitutional:  Negative for activity change, appetite change and fever.  Gastrointestinal:  Negative for abdominal pain and nausea.  Endocrine: Negative for cold intolerance and heat intolerance.  Genitourinary:  Negative for decreased urine volume, dysuria and hematuria.  Skin:  Negative for rash.  Neurological:  Negative for syncope and weakness.  Psychiatric/Behavioral:  Negative for confusion. The patient is not nervous/anxious.   See other pertinent positives and negatives in HPI.  Current Outpatient Medications on File Prior to Visit  Medication Sig Dispense Refill   Accu-Chek Softclix Lancets lancets Use to test 1-2 times daily. 100 each 12   amLODipine -olmesartan  (AZOR ) 10-40 MG tablet Take 1 tablet by mouth daily. 90 tablet 1   atorvastatin  (LIPITOR) 10 MG  tablet Take 1 tablet (10 mg total) by mouth daily. 90 tablet 3   Dulaglutide (TRULICITY) 1.5 MG/0.5ML SOAJ Inject 1.5 mg into the skin once a week. 2 mL 2   fluticasone  (FLONASE ) 50 MCG/ACT nasal spray Place 2 sprays into both nostrils daily. 16 g 6   glucose blood (ACCU-CHEK GUIDE) test strip Use to test 1-2 times daily. 100 each 12   metFORMIN  (GLUCOPHAGE ) 500 MG tablet Take 2 tablets (1,000 mg total) by mouth 2 (two) times daily with a meal. 360 tablet 2   montelukast  (SINGULAIR ) 10 MG tablet Take 1 tablet (10 mg total) by mouth at bedtime. 90 tablet 3   valACYclovir  (VALTREX ) 500 MG tablet 1 tab bid x 3 days and repeat treatment for further flare ups as soon as lesion appears. 6 tablet 2   No current facility-administered medications on file prior to visit.   Past Medical History:  Diagnosis Date   Allergy    DVT (deep venous thrombosis) (HCC) 2016   Hyperlipidemia    No Known Allergies  Social History   Socioeconomic History   Marital status: Single    Spouse name: Not on file   Number of children: Not on file   Years of education: Not on file   Highest education level: Not on file  Occupational History   Not on file  Tobacco Use   Smoking status: Former   Smokeless tobacco: Never  Substance and Sexual Activity   Alcohol use: Not Currently   Drug use: Never   Sexual activity: Yes  Partners: Female  Other Topics Concern   Not on file  Social History Narrative   Not on file   Social Drivers of Health   Financial Resource Strain: Not on file  Food Insecurity: Not on file  Transportation Needs: Not on file  Physical Activity: Not on file  Stress: Not on file  Social Connections: Unknown (11/24/2021)   Received from [facility]   Social Network    Social Network: Not on file   Vitals:   06/13/23 1417  BP: 126/80  Pulse: 94  Resp: 12  SpO2: 98%   Wt Readings from Last 3 Encounters:  06/13/23 264 lb (119.7 kg)  04/21/23 265 lb 2 oz (120.3  kg)  12/21/22 263 lb 8 oz (119.5 kg)  Body mass index is 34.83 kg/m.  Physical Exam Vitals and nursing note reviewed.  Constitutional:      General: He is not in acute distress.    Appearance: He is well-developed.  HENT:     Head: Normocephalic and atraumatic.  Eyes:     Conjunctiva/sclera: Conjunctivae normal.  Cardiovascular:     Rate and Rhythm: Normal rate and regular rhythm.     Heart sounds: No murmur heard.    Comments: Trace pitting LE edema, bilateral. Pulmonary:     Effort: Pulmonary effort is normal. No respiratory distress.     Breath sounds: Normal breath sounds.  Abdominal:     Palpations: Abdomen is soft. There is no mass.     Tenderness: There is no abdominal tenderness.  Skin:    General: Skin is warm.     Findings: No erythema or rash.  Neurological:     Mental Status: He is alert and oriented to person, place, and time.     Cranial Nerves: No cranial nerve deficit.     Gait: Gait normal.  Psychiatric:        Mood and Affect: Mood and affect normal.   ASSESSMENT AND PLAN:  [name] was seen today for ED.   Erectile dysfunction, unspecified erectile dysfunction type Assessment & Plan: We discussed possible etiologies, treatment options,and some side effects of medication. Aggressive management of some of his chronic medical conditions may help. We can add TSH to next blood work. He would like to try Sildenafil, starting with 20 mg daily prn and titrate up to 100 mg if needed. He will let me know about effectiveness, may consider Cialis or vardenafil (levitra) if sildenafil doe snot help. Keep appt in 10/2023.   Orders: -     Sildenafil Citrate; Take 2-5 tablets (40-100 mg total) by mouth daily as needed.  Dispense: 60 tablet; Refill: 1  Class 1 obesity due to excess calories with serious comorbidity and body mass index (BMI) of 34.0 to 34.9 in adult Assessment & Plan: We discussed the benefits of wt loss as well as adverse effects of  obesity. Wt loss will definitively help with his other comorbilities. Consistency with healthy diet and physical activity encouraged.    Return if symptoms worsen or fail to improve, for keep next appointment.  I, Fritz Jewel Wierda, acting as a scribe for Eliazar Olivar [place], MD., have documented all relevant documentation on the behalf of Derek Tally [place], MD, as directed by  Shawnelle Spoerl [place], MD while in the presence of Dannie Hattabaugh [place], MD.   I, Cashton Hosley [place], MD, have reviewed all documentation for this visit. The documentation on 06/13/23 for the exam, diagnosis, procedures, and orders are all accurate and complete.  Ninette Basque  G. [place], MD  [facility]. [place] office.

## 2023-06-16 ENCOUNTER — Telehealth: Payer: Self-pay

## 2023-06-16 ENCOUNTER — Other Ambulatory Visit (HOSPITAL_COMMUNITY): Payer: Self-pay

## 2023-06-16 NOTE — Telephone Encounter (Signed)
 Pharmacy Patient Advocate Encounter   Received notification from Physician's Office that prior authorization for Sildenafil 20 is required/requested.   Insurance verification completed.   The patient is insured through [employer] .   Per test claim: PA required; PA submitted to above mentioned insurance via CoverMyMeds Key/confirmation #/EOC [id number] Status is pending

## 2023-06-23 ENCOUNTER — Other Ambulatory Visit (HOSPITAL_COMMUNITY): Payer: Self-pay

## 2023-06-27 ENCOUNTER — Other Ambulatory Visit (HOSPITAL_COMMUNITY): Payer: Self-pay

## 2023-06-27 NOTE — Telephone Encounter (Signed)
 Pharmacy Patient Advocate Encounter  Received notification from [facility] that Prior Authorization for Sildenafil  20 has been DENIED.  Full denial letter will be uploaded to the media tab. See denial reason below.   PA #/Case ID/Reference #: [id number]

## 2023-07-18 ENCOUNTER — Other Ambulatory Visit: Payer: Self-pay | Admitting: Family Medicine

## 2023-07-25 ENCOUNTER — Encounter: Payer: Self-pay | Admitting: Family Medicine

## 2023-07-26 ENCOUNTER — Other Ambulatory Visit: Payer: Self-pay | Admitting: Family Medicine

## 2023-07-26 DIAGNOSIS — E1169 Type 2 diabetes mellitus with other specified complication: Secondary | ICD-10-CM

## 2023-07-26 MED ORDER — TRULICITY 1.5 MG/0.5ML ~~LOC~~ SOAJ: 1.5000 mg | SUBCUTANEOUS | 2 refills | Status: DC

## 2023-10-04 ENCOUNTER — Telehealth: Payer: Self-pay | Admitting: Family Medicine

## 2023-10-04 NOTE — Telephone Encounter (Signed)
 Copied from CRM [id number]. Topic: Clinical - Medication Prior Auth >> Oct 04, 2023  3:58 PM Tinnie C wrote: Reason for CRM: Pt requesting prior auth for trulicity  for new insurance coverage with [employer] (member ID: [id number]). It is coming back inactive, but he says it kicked in on 9/1.

## 2023-10-05 ENCOUNTER — Other Ambulatory Visit (HOSPITAL_COMMUNITY): Payer: Self-pay

## 2023-10-06 ENCOUNTER — Telehealth: Payer: Self-pay

## 2023-10-06 ENCOUNTER — Other Ambulatory Visit (HOSPITAL_COMMUNITY): Payer: Self-pay

## 2023-10-06 NOTE — Telephone Encounter (Signed)
 Pharmacy Patient Advocate Encounter   Received notification from Onbase that prior authorization for Trulicity  1.5 is required/requested.   Insurance verification completed.   The patient is insured through [facility] MEDICAID .   Per test claim: PA required; PA submitted to above mentioned insurance via Latent Key/confirmation #/EOC [id number] Status is pending

## 2023-10-06 NOTE — Telephone Encounter (Signed)
 Pharmacy Patient Advocate Encounter   Received notification from Onbase that prior authorization for Azor  10-40 mg is required/requested.   Insurance verification completed.   The patient is insured through [facility] MEDICAID .   Per test claim: PA required; PA submitted to above mentioned insurance via Latent Key/confirmation #/EOC [id number] Status is pending

## 2023-10-06 NOTE — Telephone Encounter (Signed)
 Pharmacy Patient Advocate Encounter  Received notification from [facility] MEDICAID that Prior Authorization for Trulicity  1.5 has been APPROVED from 10/06/23 to 10/05/24. Ran test claim, Copay is $35.00. This test claim was processed through [facility]- copay amounts may vary at other pharmacies due to pharmacy/plan contracts, or as the patient moves through the different stages of their insurance plan.     PA #/Case ID/Reference #: [id number]

## 2023-10-09 NOTE — Telephone Encounter (Signed)
 Pharmacy Patient Advocate Encounter  Received notification from [facility] MEDICAID that Prior Authorization for Azor  10-40 has been DENIED.  Full denial letter will be uploaded to the media tab. See denial reason below.     PA #/Case ID/Reference #: [id number]

## 2023-10-27 ENCOUNTER — Encounter: Payer: Self-pay | Admitting: Family Medicine

## 2023-10-27 ENCOUNTER — Telehealth: Payer: Self-pay

## 2023-10-27 ENCOUNTER — Other Ambulatory Visit (HOSPITAL_COMMUNITY): Payer: Self-pay

## 2023-10-27 ENCOUNTER — Ambulatory Visit: Admitting: Family Medicine

## 2023-10-27 ENCOUNTER — Ambulatory Visit: Payer: Self-pay | Admitting: Family Medicine

## 2023-10-27 VITALS — BP 120/80 | HR 79 | Temp 97.6°F | Resp 16 | Ht 73.0 in | Wt 264.6 lb

## 2023-10-27 DIAGNOSIS — E785 Hyperlipidemia, unspecified: Secondary | ICD-10-CM

## 2023-10-27 DIAGNOSIS — Z7984 Long term (current) use of oral hypoglycemic drugs: Secondary | ICD-10-CM

## 2023-10-27 DIAGNOSIS — E1169 Type 2 diabetes mellitus with other specified complication: Secondary | ICD-10-CM

## 2023-10-27 DIAGNOSIS — I10 Essential (primary) hypertension: Secondary | ICD-10-CM

## 2023-10-27 LAB — BASIC METABOLIC PANEL WITH GFR
BUN: 15 mg/dL (ref 6–23)
CO2: 26 meq/L (ref 19–32)
Calcium: 9.5 mg/dL (ref 8.4–10.5)
Chloride: 107 meq/L (ref 96–112)
Creatinine, Ser: 1.05 mg/dL (ref 0.40–1.50)
GFR: 86.47 mL/min (ref >60.00)
Glucose, Bld: 104 mg/dL — ABNORMAL HIGH (ref 70–99)
Potassium: 3.7 meq/L (ref 3.5–5.1)
Sodium: 140 meq/L (ref 135–145)

## 2023-10-27 LAB — LIPID PANEL
Cholesterol: 122 mg/dL (ref 0–200)
HDL: 29.6 mg/dL — ABNORMAL LOW (ref >39.00)
LDL Cholesterol: 71 mg/dL (ref 0–99)
NonHDL: 92.22
Total CHOL/HDL Ratio: 4
Triglycerides: 105 mg/dL (ref 0.0–149.0)
VLDL: 21 mg/dL (ref 0.0–40.0)

## 2023-10-27 LAB — POCT GLYCOSYLATED HEMOGLOBIN (HGB A1C): Hemoglobin A1C: 6.1 % — AB (ref 4.0–5.6)

## 2023-10-27 MED ORDER — METFORMIN HCL 500 MG PO TABS: 1000.0000 mg | ORAL_TABLET | Freq: Two times a day (BID) | ORAL | 2 refills | Status: AC

## 2023-10-27 MED ORDER — TRULICITY 1.5 MG/0.5ML ~~LOC~~ SOAJ: 1.5000 mg | SUBCUTANEOUS | 2 refills | Status: AC

## 2023-10-27 MED ORDER — AMLODIPINE-OLMESARTAN 10-40 MG PO TABS: 1.0000 | ORAL_TABLET | Freq: Every day | ORAL | 1 refills | Status: DC

## 2023-10-27 NOTE — Progress Notes (Addendum)
 Chief Complaint  Patient presents with   Follow-up   HPI DerekOrmand Derek Wang is a 43 y.o. male with past medical history significant for hypertension, OSA, hyperlipidemia, DM 2 here today for chronic disease management. Last seen on 06/13/23.  Hypertension: Currently he is on amlodipine -olmesartan  10-40 mg daily.  According to patient, he is healthy and sure.  Diabetes currently with medication, he is still getting medication so far and he would like to continue if possible. Negative for unusual or severe headache, visual changes, exertional chest pain, dyspnea,  focal weakness, or edema.  Lab Results  Component Value Date   CREATININE 0.99 04/21/2023   BUN 17 04/21/2023   NA 139 04/21/2023   K 3.8 04/21/2023   CL 103 04/21/2023   CO2 25 04/21/2023   Diabetes Mellitus II: Diagnosed in 06/2019 with a hemoglobin A1c of 6.7. Checking BG at home: 90-120's Currently is on metformin  at 1000 mg twice daily with food and Trulicity  1.5 mg weekly. He has tolerated medication, no side effects reported. Exercise: He is not exercising regularly but trying to get 10,000 steps daily at work  Negative for symptoms of hypoglycemia, polyuria, polydipsia, numbness extremities, foot ulcers/trauma  Lab Results  Component Value Date   HGBA1C 7.6 (A) 04/21/2023   Lab Results  Component Value Date   MICROALBUR 0.7 04/21/2023   Hyperlipidemia: Currently he is on atorvastatin  10 mg daily. Lab Results  Component Value Date   CHOL 115 08/16/2022   HDL 35.30 (L) 08/16/2022   LDLCALC 62 08/16/2022   TRIG 87.0 08/16/2022   CHOLHDL 3 08/16/2022   Review of Systems  Constitutional:  Negative for activity change, appetite change, chills and fever.  HENT:  Negative for nosebleeds and sore throat.   Respiratory:  Negative for cough and wheezing.   Gastrointestinal:  Negative for abdominal pain, nausea and vomiting.  Genitourinary:  Negative for decreased urine volume, dysuria and hematuria.   Skin:  Negative for rash.  Neurological:  Negative for syncope and facial asymmetry.  See other pertinent positives and negatives in HPI.  Current Outpatient Medications on File Prior to Visit  Medication Sig Dispense Refill   Accu-Chek Softclix Lancets lancets Use to test 1-2 times daily. 100 each 12   atorvastatin  (LIPITOR) 10 MG tablet Take 1 tablet (10 mg total) by mouth daily. 90 tablet 3   Dulaglutide  (TRULICITY ) 1.5 MG/0.5ML SOAJ Inject 1.5 mg into the skin once a week. 2 mL 2   fluticasone  (FLONASE ) 50 MCG/ACT nasal spray Place 2 sprays into both nostrils daily. 16 g 6   glucose blood (ACCU-CHEK GUIDE) test strip Use to test 1-2 times daily. 100 each 12   metFORMIN  (GLUCOPHAGE ) 500 MG tablet Take 2 tablets (1,000 mg total) by mouth 2 (two) times daily with a meal. 360 tablet 2   montelukast  (SINGULAIR ) 10 MG tablet Take 1 tablet (10 mg total) by mouth at bedtime. 90 tablet 3   sildenafil  (REVATIO ) 20 MG tablet Take 2-5 tablets (40-100 mg total) by mouth daily as needed. 60 tablet 1   valACYclovir  (VALTREX ) 500 MG tablet 1 tab bid x 3 days and repeat treatment for further flare ups as soon as lesion appears. 6 tablet 2   No current facility-administered medications on file prior to visit.    Past Medical History:  Diagnosis Date   Allergy    DVT (deep venous thrombosis) (HCC) 2016   Hyperlipidemia    No Known Allergies  Social History   Socioeconomic History  Marital status: Single    Spouse name: Not on file   Number of children: Not on file   Years of education: Not on file   Highest education level: Not on file  Occupational History   Not on file  Tobacco Use   Smoking status: Former   Smokeless tobacco: Never  Substance and Sexual Activity   Alcohol use: Not Currently   Drug use: Never   Sexual activity: Yes    Partners: Female  Other Topics Concern   Not on file  Social History Narrative   Not on file   Social Drivers of Health   Financial Resource  Strain: Not on file  Food Insecurity: Not on file  Transportation Needs: Not on file  Physical Activity: Not on file  Stress: Not on file  Social Connections: Unknown (11/24/2021)   Received from [facility]   Social Network    Social Network: Not on file   Today's Vitals   10/27/23 0703  BP: 120/80  Pulse: 79  Resp: 16  Temp: 97.6 F (36.4 C)  SpO2: 96%  Weight: 264 lb 9.6 oz (120 kg)  Height: 6' 1 (1.854 m)   Body mass index is 34.91 kg/m.  Physical Exam Vitals and nursing note reviewed.  Constitutional:      General: He is not in acute distress.    Appearance: He is well-developed.  HENT:     Head: Normocephalic and atraumatic.     Mouth/Throat:     Mouth: Mucous membranes are moist.     Pharynx: Oropharynx is clear. Uvula midline.  Eyes:     Conjunctiva/sclera: Conjunctivae normal.  Cardiovascular:     Rate and Rhythm: Normal rate and regular rhythm.     Pulses:          Dorsalis pedis pulses are 2+ on the right side and 2+ on the left side.     Heart sounds: No murmur heard. Pulmonary:     Effort: Pulmonary effort is normal. No respiratory distress.     Breath sounds: Normal breath sounds.  Abdominal:     Palpations: Abdomen is soft. There is no hepatomegaly or mass.     Tenderness: There is no abdominal tenderness.  Musculoskeletal:     Right lower leg: No edema.     Left lower leg: No edema.  Skin:    General: Skin is warm.     Findings: No erythema or rash.  Neurological:     Mental Status: He is alert and oriented to person, place, and time.     Cranial Nerves: No cranial nerve deficit.     Gait: Gait normal.  Psychiatric:        Mood and Affect: Mood and affect normal.   ASSESSMENT AND PLAN:  [name] was seen today for follow-up.  Diagnoses and all orders for this visit:  Orders Placed This Encounter  Procedures   Basic metabolic panel with GFR   Lipid panel   POC HgB A1c   Lab Results  Component Value Date   HGBA1C 6.1  (A) 10/27/2023   Lab Results  Component Value Date   NA 140 10/27/2023   CL 107 10/27/2023   K 3.7 10/27/2023   CO2 26 10/27/2023   BUN 15 10/27/2023   CREATININE 1.05 10/27/2023   GFR 86.47 10/27/2023   CALCIUM  9.5 10/27/2023   ALBUMIN 4.6 04/21/2023   GLUCOSE 104 (H) 10/27/2023   Lab Results  Component Value Date   CHOL  122 10/27/2023   HDL 29.60 (L) 10/27/2023   LDLCALC 71 10/27/2023   TRIG 105.0 10/27/2023   CHOLHDL 4 10/27/2023   Type 2 diabetes mellitus with other specified complication, without long-term current use of insulin (HCC) Assessment & Plan: HgA1C at goal, it went from 7.6 to 6.1. Continue Trulicity  1.5 mg weekly and metformin  at 1000 mg twice daily with meals. Annual eye exam, periodic dental and foot care recommended. F/U in 5-6 months.  Orders: -     POCT glycosylated hemoglobin (Hb A1C) -     metFORMIN  HCl; Take 2 tablets (1,000 mg total) by mouth 2 (two) times daily with a meal.  Dispense: 360 tablet; Refill: 2 -     Trulicity ; Inject 1.5 mg into the skin once a week.  Dispense: 2 mL; Refill: 2  Hyperlipidemia associated with type 2 diabetes mellitus (HCC) Assessment & Plan: Last LDL 62 in 08/2022. Continue atorvastatin  10 mg daily and low-fat diet. Further recommendation will be given according to lipid panel result.  Orders: -     Lipid panel; Future  Primary hypertension Assessment & Plan: BP adequately controlled. Currently on amlodipine -olmesartan  10-40 mg daily, it seems like he is having some trouble with insurance covering medication.  We will wait he will get a PA form faxed to our office to be process. We discussed other options but he would like to continue with same medication if possible. Continue low-salt diet. Eye exam is pending. Follow-up in 6 months.   Orders: -     amLODIPine -Olmesartan ; Take 1 tablet by mouth daily.  Dispense: 90 tablet; Refill: 1 -     Basic metabolic panel with GFR; Future   Return in about 6 months  (around 04/25/2024) for chronic problems.  Dreshawn Hendershott [place], MD [facility]. [place] office.

## 2023-10-27 NOTE — Assessment & Plan Note (Signed)
 HgA1C at goal, it went from 7.6 to 6.1. Continue Trulicity  1.5 mg weekly and metformin  at 1000 mg twice daily with meals. Annual eye exam, periodic dental and foot care recommended. F/U in 5-6 months.

## 2023-10-27 NOTE — Telephone Encounter (Signed)
 Pharmacy Patient Advocate Encounter   Received notification from Onbase that prior authorization for Azor  10-40 is required/requested.   Insurance verification completed.   The patient is insured through [facility] .   Per test claim: PA request previously denied. See encounter 10/09/23.

## 2023-10-27 NOTE — Assessment & Plan Note (Signed)
 Last LDL 62 in 08/2022. Continue atorvastatin  10 mg daily and low-fat diet. Further recommendation will be given according to lipid panel result.

## 2023-10-27 NOTE — Addendum Note (Signed)
 Addended by: SWAZILAND, [place] Paiz G on: 10/27/2023 12:26 PM   Modules accepted: Orders

## 2023-10-27 NOTE — Assessment & Plan Note (Signed)
 BP adequately controlled. Currently on amlodipine -olmesartan  10-40 mg daily, it seems like he is having some trouble with insurance covering medication.  We will wait he will get a PA form faxed to our office to be process. We discussed other options but he would like to continue with same medication if possible. Continue low-salt diet. Eye exam is pending. Follow-up in 6 months.

## 2023-10-27 NOTE — Patient Instructions (Addendum)
 A few things to remember from today's visit:  Type 2 diabetes mellitus with other specified complication, without long-term current use of insulin (HCC) - Plan: POC HgB A1c  Hyperlipidemia associated with type 2 diabetes mellitus (HCC) - Plan: Lipid panel  Primary hypertension - Plan: amLODipine -olmesartan  (AZOR ) 10-40 MG tablet, Basic metabolic panel with GFR  No changes today.  If you need refills for medications you take chronically, please call your pharmacy. Do not use My Chart to request refills or for acute issues that need immediate attention. If you send a my chart message, it may take a few days to be addressed, specially if I am not in the office.  Please be sure medication list is accurate. If a new problem present, please set up appointment sooner than planned today.

## 2023-11-01 ENCOUNTER — Other Ambulatory Visit: Payer: Self-pay | Admitting: Family Medicine

## 2023-11-01 DIAGNOSIS — I10 Essential (primary) hypertension: Secondary | ICD-10-CM

## 2023-11-01 MED ORDER — AMLODIPINE BESYLATE-VALSARTAN 10-320 MG PO TABS: 1.0000 | ORAL_TABLET | Freq: Every day | ORAL | 1 refills | Status: AC

## 2023-11-01 NOTE — Telephone Encounter (Signed)
 Does his health insurance covers Amlodipine -Valsartan, if so we can change to this medication, 10-320 mg daily. If treatment is changed, he needs to monitor BP most frequently for the first 2 weeks to be sure BP is still at goal. Thanks, BJ

## 2023-11-01 NOTE — Telephone Encounter (Signed)
 Prescription for amlodipine -valsartan 10-320 mg was sent to his pharmacy. Amlodipine -olmesartan  10-40 mg is to be discontinued. Terril Chestnut [place], MD

## 2023-11-01 NOTE — Telephone Encounter (Signed)
 Spoke with patient regarding medication. Advised patient Derek Wang is new recommendation. He is agreeable to see if insurance will cover. Advised patient to monitor BP each morning after waking up and using the restroom. Advised patient to notify office if BP is not controlled on new medication. Advised patient to contact office if insurance does not cover. Patient would like RX sent to [facility] on [address] In [facility].

## 2023-12-20 ENCOUNTER — Telehealth: Payer: Self-pay | Admitting: Family Medicine

## 2023-12-20 DIAGNOSIS — B001 Herpesviral vesicular dermatitis: Secondary | ICD-10-CM

## 2023-12-20 MED ORDER — VALACYCLOVIR HCL 500 MG PO TABS: ORAL_TABLET | ORAL | 2 refills | Status: AC

## 2023-12-20 NOTE — Telephone Encounter (Signed)
 Rx sent.

## 2023-12-20 NOTE — Telephone Encounter (Signed)
 Copied from CRM [id number]. Topic: Clinical - Medication Refill >> Dec 20, 2023 11:21 AM Harlene ORN wrote: Medication: valACYclovir  (VALTREX ) 500 MG tablet  Has the patient contacted their pharmacy? No (Agent: If no, request that the patient contact the pharmacy for the refill. If patient does not wish to contact the pharmacy document the reason why and proceed with request.) (Agent: If yes, when and what did the pharmacy advise?)  This is the patient's preferred pharmacy:  [id number] - [place], [place] - [address] AT [facility] [address] [place] [address] Phone: [phone] Fax: [phone]  Is this the correct pharmacy for this prescription? Yes If no, delete pharmacy and type the correct one.   Has the prescription been filled recently? No  Is the patient out of the medication? Yes  Has the patient been seen for an appointment in the last year OR does the patient have an upcoming appointment? Yes  Can we respond through MyChart? Yes  Agent: Please be advised that Rx refills may take up to 3 business days. We ask that you follow-up with your pharmacy.

## 2024-04-26 ENCOUNTER — Ambulatory Visit: Admitting: Family Medicine
